# Patient Record
Sex: Female | Born: 1935 | Race: Black or African American | Hispanic: No | Marital: Single | Smoking: Former smoker
Health system: Southern US, Community
[De-identification: ages and names within clinical notes are randomized; demographics above are authoritative.]

## PROBLEM LIST (undated history)

## (undated) DIAGNOSIS — I639 Cerebral infarction, unspecified: Secondary | ICD-10-CM

## (undated) DIAGNOSIS — E785 Hyperlipidemia, unspecified: Secondary | ICD-10-CM

## (undated) DIAGNOSIS — R296 Repeated falls: Secondary | ICD-10-CM

## (undated) DIAGNOSIS — R195 Other fecal abnormalities: Secondary | ICD-10-CM

## (undated) DIAGNOSIS — I10 Essential (primary) hypertension: Secondary | ICD-10-CM

## (undated) DIAGNOSIS — I82409 Acute embolism and thrombosis of unspecified deep veins of unspecified lower extremity: Secondary | ICD-10-CM

## (undated) DIAGNOSIS — K219 Gastro-esophageal reflux disease without esophagitis: Secondary | ICD-10-CM

## (undated) DIAGNOSIS — R338 Other retention of urine: Secondary | ICD-10-CM

## (undated) DIAGNOSIS — F0390 Unspecified dementia without behavioral disturbance: Secondary | ICD-10-CM

## (undated) DIAGNOSIS — I4891 Unspecified atrial fibrillation: Secondary | ICD-10-CM

## (undated) DIAGNOSIS — I2699 Other pulmonary embolism without acute cor pulmonale: Secondary | ICD-10-CM

## (undated) DIAGNOSIS — S2020XA Contusion of thorax, unspecified, initial encounter: Secondary | ICD-10-CM

## (undated) DIAGNOSIS — D649 Anemia, unspecified: Secondary | ICD-10-CM

## (undated) DIAGNOSIS — M48061 Spinal stenosis, lumbar region without neurogenic claudication: Secondary | ICD-10-CM

## (undated) HISTORY — DX: Gastro-esophageal reflux disease without esophagitis: K21.9

## (undated) HISTORY — DX: Contusion of thorax, unspecified, initial encounter: S20.20XA

## (undated) HISTORY — DX: Cerebral infarction, unspecified: I63.9

## (undated) HISTORY — DX: Essential (primary) hypertension: I10

## (undated) HISTORY — DX: Anemia, unspecified: D64.9

## (undated) HISTORY — DX: Unspecified atrial fibrillation: I48.91

## (undated) HISTORY — DX: Other pulmonary embolism without acute cor pulmonale: I26.99

## (undated) HISTORY — DX: Hyperlipidemia, unspecified: E78.5

## (undated) HISTORY — DX: Spinal stenosis, lumbar region without neurogenic claudication: M48.061

## (undated) HISTORY — DX: Other fecal abnormalities: R19.5

## (undated) HISTORY — DX: Other retention of urine: R33.8

## (undated) HISTORY — DX: Acute embolism and thrombosis of unspecified deep veins of unspecified lower extremity: I82.409

## (undated) HISTORY — DX: Repeated falls: R29.6

## (undated) HISTORY — DX: Unspecified dementia, unspecified severity, without behavioral disturbance, psychotic disturbance, mood disturbance, and anxiety: F03.90

---

## 2003-05-12 ENCOUNTER — Ambulatory Visit (HOSPITAL_COMMUNITY): Admission: RE | Admit: 2003-05-12 | Discharge: 2003-05-12 | Payer: Self-pay | Admitting: Neurology

## 2005-03-11 ENCOUNTER — Emergency Department: Payer: Self-pay | Admitting: Emergency Medicine

## 2005-06-29 ENCOUNTER — Inpatient Hospital Stay: Payer: Self-pay | Admitting: General Practice

## 2006-02-06 ENCOUNTER — Inpatient Hospital Stay: Payer: Self-pay | Admitting: General Practice

## 2006-02-06 ENCOUNTER — Other Ambulatory Visit: Payer: Self-pay

## 2006-04-12 ENCOUNTER — Ambulatory Visit: Payer: Self-pay | Admitting: General Practice

## 2006-04-17 ENCOUNTER — Inpatient Hospital Stay: Payer: Self-pay | Admitting: General Practice

## 2007-07-08 ENCOUNTER — Other Ambulatory Visit: Payer: Self-pay

## 2007-07-08 ENCOUNTER — Ambulatory Visit: Payer: Self-pay | Admitting: Podiatry

## 2007-07-12 ENCOUNTER — Ambulatory Visit: Payer: Self-pay | Admitting: Podiatry

## 2008-01-07 ENCOUNTER — Ambulatory Visit: Payer: Self-pay | Admitting: Internal Medicine

## 2008-02-12 ENCOUNTER — Other Ambulatory Visit: Payer: Self-pay

## 2008-02-12 ENCOUNTER — Inpatient Hospital Stay: Payer: Self-pay | Admitting: Internal Medicine

## 2008-12-27 ENCOUNTER — Inpatient Hospital Stay: Payer: Self-pay | Admitting: Internal Medicine

## 2009-02-18 ENCOUNTER — Ambulatory Visit: Payer: Self-pay | Admitting: General Practice

## 2009-03-12 ENCOUNTER — Inpatient Hospital Stay: Payer: Self-pay | Admitting: General Practice

## 2009-03-17 ENCOUNTER — Encounter: Payer: Self-pay | Admitting: Internal Medicine

## 2009-03-25 ENCOUNTER — Encounter: Payer: Self-pay | Admitting: Internal Medicine

## 2010-03-24 ENCOUNTER — Ambulatory Visit: Payer: Self-pay | Admitting: Internal Medicine

## 2010-12-02 ENCOUNTER — Ambulatory Visit: Payer: Self-pay

## 2018-11-25 ENCOUNTER — Encounter
Admission: RE | Admit: 2018-11-25 | Discharge: 2018-11-25 | Disposition: A | Discharge status: Home / Self Care | Payer: Medicare Other | Source: Ambulatory Visit | Attending: Internal Medicine | Admitting: Internal Medicine

## 2018-12-04 ENCOUNTER — Encounter: Payer: Self-pay | Admitting: Adult Health

## 2018-12-04 ENCOUNTER — Non-Acute Institutional Stay (SKILLED_NURSING_FACILITY): Payer: Medicare Other | Admitting: Adult Health

## 2018-12-04 DIAGNOSIS — F0150 Vascular dementia without behavioral disturbance: Secondary | ICD-10-CM

## 2018-12-04 DIAGNOSIS — I82591 Chronic embolism and thrombosis of other specified deep vein of right lower extremity: Secondary | ICD-10-CM | POA: Diagnosis not present

## 2018-12-04 DIAGNOSIS — I2782 Chronic pulmonary embolism: Secondary | ICD-10-CM

## 2018-12-04 DIAGNOSIS — R5381 Other malaise: Secondary | ICD-10-CM

## 2018-12-04 DIAGNOSIS — E785 Hyperlipidemia, unspecified: Secondary | ICD-10-CM

## 2018-12-04 DIAGNOSIS — I4819 Other persistent atrial fibrillation: Secondary | ICD-10-CM | POA: Diagnosis not present

## 2018-12-04 DIAGNOSIS — I10 Essential (primary) hypertension: Secondary | ICD-10-CM | POA: Diagnosis not present

## 2018-12-04 DIAGNOSIS — K219 Gastro-esophageal reflux disease without esophagitis: Secondary | ICD-10-CM

## 2018-12-04 DIAGNOSIS — D500 Iron deficiency anemia secondary to blood loss (chronic): Secondary | ICD-10-CM

## 2018-12-04 DIAGNOSIS — R338 Other retention of urine: Secondary | ICD-10-CM

## 2018-12-04 NOTE — Progress Notes (Signed)
[facility]:   [facility] Room Number: [id number] Place of Service:  SNF (31)   CODE STATUS: DNR  Allergies  Allergen Reactions  . Trazodone And Nefazodone Other (See Comments)    Chief Complaint  Patient presents with  . Medical Management of Chronic Issues    Essential hypertension; persistent atrial fibrillation; chronic deep vein thrombosis of other vein of right lower extremity. Weekly follow up for the first 30 days post hospitalization.     HPI:  She is a 84 year old short term resident being seen for the management of her chronic illnesses: hypertension; dvt; afib. She is unable to fully participate in the hpi or ros; she does deny any chest pain; no change in appetite; no pain.   Past Medical History:  Diagnosis Date  . Acute retention of urine   . Anemia   . Atrial fibrillation (HCC)   . Dementia (HCC)   . DVT (deep venous thrombosis) (HCC)    on right lower extremity   . Falls frequently   . GERD (gastroesophageal reflux disease)   . Heme positive stool   . Hyperlipidemia   . Hypertension   . Lumbar spinal stenosis   . Pulmonary emboli (HCC)   . Stroke (HCC)   . Traumatic hematoma of right thoracic region     No past surgical history on file.  Social History   Socioeconomic History  . Marital status: Single    Spouse name: Not on file  . Number of children: Not on file  . Years of education: Not on file  . Highest education level: Not on file  Occupational History  . Not on file  Social Needs  . Financial resource strain: Not on file  . Food insecurity:    Worry: Not on file    Inability: Not on file  . Transportation needs:    Medical: Not on file    Non-medical: Not on file  Tobacco Use  . Smoking status: Former Smoker    Last attempt to quit: 1988    Years since quitting: 31.9  . Smokeless tobacco: Never Used  Substance and Sexual Activity  . Alcohol use: Never    Frequency: Never  . Drug use: Never  . Sexual  activity: Not Currently  Lifestyle  . Physical activity:    Days per week: Not on file    Minutes per session: Not on file  . Stress: Not on file  Relationships  . Social connections:    Talks on phone: Not on file    Gets together: Not on file    Attends religious service: Not on file    Active member of club or organization: Not on file    Attends meetings of clubs or organizations: Not on file    Relationship status: Not on file  . Intimate partner violence:    Fear of current or ex partner: Not on file    Emotionally abused: Not on file    Physically abused: Not on file    Forced sexual activity: Not on file  Other Topics Concern  . Not on file  Social History Narrative  . Not on file   No family history on file.    VITAL SIGNS BP (!) 155/62   Pulse 73   Temp 97.9 F (36.6 C)   Resp 18   Wt 130 lb 9.6 oz (59.2 kg)   SpO2 100%   Outpatient Encounter Medications as of 12/04/2018  Medication  Sig  . acetaminophen (TYLENOL) 325 MG tablet Take 650 mg by mouth every 4 (four) hours as needed.  Marland Kitchen. amLODipine (NORVASC) 5 MG tablet Take 5 mg by mouth daily.  Marland Kitchen. aspirin 325 MG tablet Take 325 mg by mouth daily.  Marland Kitchen. atorvastatin (LIPITOR) 10 MG tablet Take 10 mg by mouth at bedtime.  . calcium-vitamin D (OSCAL WITH D) 500-200 MG-UNIT tablet Take 1 tablet by mouth daily.  Marland Kitchen. donepezil (ARICEPT) 10 MG tablet Take 10 mg by mouth at bedtime.  . ferrous sulfate 325 (65 FE) MG tablet Take 325 mg by mouth daily with breakfast.  . losartan (COZAAR) 50 MG tablet Take 50 mg by mouth daily.  . Metoprolol Tartrate 75 MG TABS Take 1 tablet by mouth 2 (two) times daily.  . Multiple Vitamins-Minerals (MULTIVITAMIN) tablet Take 1 tablet by mouth daily.  . NON FORMULARY Diet Type: Regular  . pantoprazole (PROTONIX) 40 MG tablet Take 40 mg by mouth daily.  . tamsulosin (FLOMAX) 0.4 MG CAPS capsule Take 0.4 mg by mouth daily.  . vitamin B-12 (CYANOCOBALAMIN) 250 MCG tablet Take 250 mcg by mouth  daily.  . Vitamin D, Cholecalciferol, 25 MCG (1000 UT) TABS Take 1 tablet by mouth daily.   No facility-administered encounter medications on file as of 12/04/2018.      SIGNIFICANT DIAGNOSTIC EXAMS  LABS REVIEWED TODAY:   11-21-18: wbc 5.9; hgb 10.8; hct 34.0; plt 179; bun 21; creat 0.65; k+ 4.2 na++ 140; ca 9.0   Review of Systems  Unable to perform ROS: Dementia (confusion )   Physical Exam Constitutional:      General: She is not in acute distress.    Appearance: Normal appearance. She is well-developed and normal weight. She is not diaphoretic.  HENT:     Mouth/Throat:     Mouth: Mucous membranes are moist.     Pharynx: Oropharynx is clear.  Neck:     Thyroid: No thyromegaly.  Cardiovascular:     Rate and Rhythm: Normal rate and regular rhythm.     Pulses: Normal pulses.     Heart sounds: Normal heart sounds.  Pulmonary:     Effort: Pulmonary effort is normal. No respiratory distress.     Breath sounds: Normal breath sounds.  Abdominal:     General: Bowel sounds are normal. There is no distension.     Palpations: Abdomen is soft.     Tenderness: There is no abdominal tenderness.  Musculoskeletal:     Right lower leg: No edema.     Left lower leg: No edema.     Comments: Is able to move all extremities   Lymphadenopathy:     Cervical: No cervical adenopathy.  Skin:    General: Skin is warm and dry.  Neurological:     General: No focal deficit present.     Mental Status: She is alert. Mental status is at baseline.  Psychiatric:        Mood and Affect: Mood normal.       ASSESSMENT/ PLAN:  TODAY:    1. Essential hypertension: is stable b/p 155/62: will continue norvasc 5 mg daily cozaar 50 mg daily and lopressor 75 mg twice daily   2. Persistent atrial fibrillation: heart rate is stable: will continue asa 325 mg daily and takes lopressor 75 mg twice daily   3. DVT right lower extremity/ PE: is stable will continue asa 325 mg daily   4. Dyslipidemia:  is stable will continue lipitor 10 mg daily  5. gerd without esophagitis: is stable will continue protonix 40 mg daily   6. Acute urinary retention: is stable will continue flomax 0.4 mg daily   7. Chronic blood loss anemia: stable hgb is 10.8; will continue iron daily   8. Vascular dementia without behavioral disturbance: is without change: weight is 130 pounds; will continue aricept 10 mg daily   9. Physical deconditioning: will continue therapy as directed to improve upon her level of independence with her adls.    MD is aware of resident's narcotic use and is in agreement with current plan of care. We will attempt to wean resident as apropriate   Synthia Innocent NP [facility]  Contact [phone] Monday through Friday 8am- 5pm  After hours call [phone]

## 2018-12-05 ENCOUNTER — Non-Acute Institutional Stay (SKILLED_NURSING_FACILITY): Payer: Medicare Other | Admitting: Adult Health

## 2018-12-05 ENCOUNTER — Encounter: Payer: Self-pay | Admitting: Adult Health

## 2018-12-05 DIAGNOSIS — F0150 Vascular dementia without behavioral disturbance: Secondary | ICD-10-CM | POA: Diagnosis not present

## 2018-12-05 DIAGNOSIS — R5381 Other malaise: Secondary | ICD-10-CM | POA: Diagnosis not present

## 2018-12-05 DIAGNOSIS — D500 Iron deficiency anemia secondary to blood loss (chronic): Secondary | ICD-10-CM

## 2018-12-05 NOTE — Progress Notes (Signed)
[facility] Room Number: 208 B Place of Service:  SNF (31)   CODE STATUS: DNR  Allergies  Allergen Reactions  . Trazodone And Nefazodone Other (See Comments)    Chief Complaint  Patient presents with  . Acute Visit    Care Plan Meeting    HPI:  We have come together for her routine care plan meeting. More than likely this does look a long term placement for her. She has not stood for years; she will need to be able transfer herself. At this time she requires maximum assistance with transfers at this time; she continues to participate in therapy. She will continue to be followed for her chronic illnesses: dementia; gerd; hypertension. There are no reports of uncontrolled pain; no changes in appetite; no anxiety or agitation.    Past Medical History:  Diagnosis Date  . Acute retention of urine   . Anemia   . Atrial fibrillation (HCC)   . Dementia (HCC)   . DVT (deep venous thrombosis) (HCC)    on right lower extremity   . Falls frequently   . GERD (gastroesophageal reflux disease)   . Heme positive stool   . Hyperlipidemia   . Hypertension   . Lumbar spinal stenosis   . Pulmonary emboli (HCC)   . Stroke (HCC)   . Traumatic hematoma of right thoracic region     History reviewed. No pertinent surgical history.  Social History   Socioeconomic History  . Marital status: Single    Spouse name: Not on file  . Number of children: Not on file  . Years of education: Not on file  . Highest education level: Not on file  Occupational History  . Not on file  Social Needs  . Financial resource strain: Not on file  . Food insecurity:    Worry: Not on file    Inability: Not on file  . Transportation needs:    Medical: Not on file    Non-medical: Not on file  Tobacco Use  . Smoking status: Former Smoker    Last attempt to quit: 1988    Years since quitting: 31.9  . Smokeless tobacco: Never Used  Substance and Sexual Activity  .  Alcohol use: Never    Frequency: Never  . Drug use: Never  . Sexual activity: Not Currently  Lifestyle  . Physical activity:    Days per week: Not on file    Minutes per session: Not on file  . Stress: Not on file  Relationships  . Social connections:    Talks on phone: Not on file    Gets together: Not on file    Attends religious service: Not on file    Active member of club or organization: Not on file    Attends meetings of clubs or organizations: Not on file    Relationship status: Not on file  . Intimate partner violence:    Fear of current or ex partner: Not on file    Emotionally abused: Not on file    Physically abused: Not on file    Forced sexual activity: Not on file  Other Topics Concern  . Not on file  Social History Narrative  . Not on file   History reviewed. No pertinent family history.    VITAL SIGNS BP (!) 169/68   Pulse (!) 56   Temp 98 F (36.7 C)   Resp 18   Wt 130 lb 9.6 oz (59.2  kg)   SpO2 100%   Outpatient Encounter Medications as of 12/05/2018  Medication Sig  . acetaminophen (TYLENOL) 325 MG tablet Take 650 mg by mouth every 4 (four) hours as needed.  Marland Kitchen amLODipine (NORVASC) 5 MG tablet Take 5 mg by mouth daily.  Marland Kitchen aspirin 325 MG tablet Take 325 mg by mouth daily.  Marland Kitchen atorvastatin (LIPITOR) 10 MG tablet Take 10 mg by mouth at bedtime.  . calcium-vitamin D (OSCAL WITH D) 500-200 MG-UNIT tablet Take 1 tablet by mouth daily.  Marland Kitchen donepezil (ARICEPT) 10 MG tablet Take 10 mg by mouth at bedtime.  . ferrous sulfate 325 (65 FE) MG tablet Take 325 mg by mouth daily with breakfast.  . losartan (COZAAR) 50 MG tablet Take 50 mg by mouth daily.  . Metoprolol Tartrate 75 MG TABS Take 1 tablet by mouth 2 (two) times daily.  . Multiple Vitamins-Minerals (MULTIVITAMIN) tablet Take 1 tablet by mouth daily.  . NON FORMULARY Diet Type: Regular  . pantoprazole (PROTONIX) 40 MG tablet Take 40 mg by mouth daily.  . tamsulosin (FLOMAX) 0.4 MG CAPS capsule Take  0.4 mg by mouth daily.  . vitamin B-12 (CYANOCOBALAMIN) 250 MCG tablet Take 250 mcg by mouth daily.  . Vitamin D, Cholecalciferol, 25 MCG (1000 UT) TABS Take 1 tablet by mouth daily.   No facility-administered encounter medications on file as of 12/05/2018.      SIGNIFICANT DIAGNOSTIC EXAMS   LABS REVIEWED PREVIOUS:   11-21-18: wbc 5.9; hgb 10.8; hct 34.0; plt 179; bun 21; creat 0.65; k+ 4.2 na++ 140; ca 9.0  NO NEW LABS.     Review of Systems  Unable to perform ROS: Dementia (confusion)     Physical Exam Constitutional:      General: She is not in acute distress.    Appearance: Normal appearance. She is well-developed. She is not diaphoretic.  Neck:     Musculoskeletal: Neck supple.     Thyroid: No thyromegaly.  Cardiovascular:     Rate and Rhythm: Normal rate and regular rhythm.     Pulses: Normal pulses.     Heart sounds: Normal heart sounds.  Pulmonary:     Effort: Pulmonary effort is normal. No respiratory distress.     Breath sounds: Normal breath sounds.  Abdominal:     General: Bowel sounds are normal. There is no distension.     Palpations: Abdomen is soft.     Tenderness: There is no abdominal tenderness.  Musculoskeletal:     Right lower leg: No edema.     Left lower leg: No edema.     Comments: Is able to move all extremities   Lymphadenopathy:     Cervical: No cervical adenopathy.  Skin:    General: Skin is warm and dry.  Neurological:     General: No focal deficit present.     Mental Status: She is alert. Mental status is at baseline.  Psychiatric:        Mood and Affect: Mood normal.     ASSESSMENT/ PLAN:  TODAY:   1. Vascular dementia without behavioral disturbance 2. Physical deconditioning 3. Chronic blood loss anemia:   Will continue her current medications Will continue her current plan of care Will continue therapy as directed.     MD is aware of resident's narcotic use and is in agreement with current plan of care. We will  attempt to wean resident as apropriate   Synthia Innocent NP [facility]  Contact [phone] Monday through  Friday 8am- 5pm  After hours call [phone]

## 2018-12-09 ENCOUNTER — Encounter: Payer: Self-pay | Admitting: Adult Health

## 2018-12-09 DIAGNOSIS — I82501 Chronic embolism and thrombosis of unspecified deep veins of right lower extremity: Secondary | ICD-10-CM | POA: Insufficient documentation

## 2018-12-09 DIAGNOSIS — I10 Essential (primary) hypertension: Secondary | ICD-10-CM | POA: Insufficient documentation

## 2018-12-09 DIAGNOSIS — F0150 Vascular dementia without behavioral disturbance: Secondary | ICD-10-CM | POA: Insufficient documentation

## 2018-12-09 DIAGNOSIS — D500 Iron deficiency anemia secondary to blood loss (chronic): Secondary | ICD-10-CM | POA: Insufficient documentation

## 2018-12-09 DIAGNOSIS — I4819 Other persistent atrial fibrillation: Secondary | ICD-10-CM | POA: Insufficient documentation

## 2018-12-09 DIAGNOSIS — E785 Hyperlipidemia, unspecified: Secondary | ICD-10-CM | POA: Insufficient documentation

## 2018-12-09 DIAGNOSIS — R338 Other retention of urine: Secondary | ICD-10-CM | POA: Insufficient documentation

## 2018-12-09 DIAGNOSIS — I2699 Other pulmonary embolism without acute cor pulmonale: Secondary | ICD-10-CM | POA: Insufficient documentation

## 2018-12-09 DIAGNOSIS — R5381 Other malaise: Secondary | ICD-10-CM | POA: Insufficient documentation

## 2018-12-09 DIAGNOSIS — K219 Gastro-esophageal reflux disease without esophagitis: Secondary | ICD-10-CM | POA: Insufficient documentation

## 2018-12-11 ENCOUNTER — Encounter: Payer: Self-pay | Admitting: Adult Health

## 2018-12-11 ENCOUNTER — Non-Acute Institutional Stay (SKILLED_NURSING_FACILITY): Payer: Medicare Other | Admitting: Adult Health

## 2018-12-11 DIAGNOSIS — K219 Gastro-esophageal reflux disease without esophagitis: Secondary | ICD-10-CM | POA: Diagnosis not present

## 2018-12-11 DIAGNOSIS — E785 Hyperlipidemia, unspecified: Secondary | ICD-10-CM

## 2018-12-11 DIAGNOSIS — R338 Other retention of urine: Secondary | ICD-10-CM | POA: Diagnosis not present

## 2018-12-11 NOTE — Progress Notes (Signed)
[facility] Room Number: 208 B Place of Service:  SNF (31)   CODE STATUS: DNR  Allergies  Allergen Reactions  . Trazodone And Nefazodone Other (See Comments)    Chief Complaint  Patient presents with  . Medical Management of Chronic Issues    gerd without esophagitis; acute retention of urine; dyslipidemia. Weekly follow up for the first 30 days post hospitalization.     HPI:  She is a 84 year old long term resident of this facility being seen for the management of her chronic illnesses: gerd; urine retention; dyslipidemia. She is unable to fully participate in the hpi or ros; there are no reports of heart burn; no uncontrolled pain; no anxiety or insomnia.   Past Medical History:  Diagnosis Date  . Acute retention of urine   . Anemia   . Atrial fibrillation (HCC)   . Dementia (HCC)   . DVT (deep venous thrombosis) (HCC)    on right lower extremity   . Falls frequently   . GERD (gastroesophageal reflux disease)   . Heme positive stool   . Hyperlipidemia   . Hypertension   . Lumbar spinal stenosis   . Pulmonary emboli (HCC)   . Stroke (HCC)   . Traumatic hematoma of right thoracic region     History reviewed. No pertinent surgical history.  Social History   Socioeconomic History  . Marital status: Single    Spouse name: Not on file  . Number of children: Not on file  . Years of education: Not on file  . Highest education level: Not on file  Occupational History  . Not on file  Social Needs  . Financial resource strain: Not on file  . Food insecurity:    Worry: Not on file    Inability: Not on file  . Transportation needs:    Medical: Not on file    Non-medical: Not on file  Tobacco Use  . Smoking status: Former Smoker    Last attempt to quit: 1988    Years since quitting: 31.9  . Smokeless tobacco: Never Used  Substance and Sexual Activity  . Alcohol use: Never    Frequency: Never  . Drug use: Never  . Sexual  activity: Not Currently  Lifestyle  . Physical activity:    Days per week: Not on file    Minutes per session: Not on file  . Stress: Not on file  Relationships  . Social connections:    Talks on phone: Not on file    Gets together: Not on file    Attends religious service: Not on file    Active member of club or organization: Not on file    Attends meetings of clubs or organizations: Not on file    Relationship status: Not on file  . Intimate partner violence:    Fear of current or ex partner: Not on file    Emotionally abused: Not on file    Physically abused: Not on file    Forced sexual activity: Not on file  Other Topics Concern  . Not on file  Social History Narrative  . Not on file   History reviewed. No pertinent family history.    VITAL SIGNS BP (!) 122/59   Pulse 67   Temp 98.8 F (37.1 C)   Resp 20   Wt 136 lb 6.2 oz (61.9 kg)   SpO2 100%   Outpatient Encounter Medications as of 12/11/2018  Medication  Sig  . acetaminophen (TYLENOL) 325 MG tablet Take 650 mg by mouth every 4 (four) hours as needed.  Marland Kitchen amLODipine (NORVASC) 5 MG tablet Take 5 mg by mouth daily.  Marland Kitchen aspirin 325 MG tablet Take 325 mg by mouth daily.  Marland Kitchen atorvastatin (LIPITOR) 10 MG tablet Take 10 mg by mouth at bedtime.  . calcium-vitamin D (OSCAL WITH D) 500-200 MG-UNIT tablet Take 1 tablet by mouth daily.  Marland Kitchen donepezil (ARICEPT) 10 MG tablet Take 10 mg by mouth at bedtime.  . ferrous sulfate 325 (65 FE) MG tablet Take 325 mg by mouth daily with breakfast.  . losartan (COZAAR) 50 MG tablet Take 50 mg by mouth daily.  . Metoprolol Tartrate 75 MG TABS Take 1 tablet by mouth 2 (two) times daily.  . Multiple Vitamins-Minerals (MULTIVITAMIN) tablet Take 1 tablet by mouth daily.  . NON FORMULARY Diet Type: Regular  . pantoprazole (PROTONIX) 40 MG tablet Take 40 mg by mouth daily.  . tamsulosin (FLOMAX) 0.4 MG CAPS capsule Take 0.4 mg by mouth daily.  . vitamin B-12 (CYANOCOBALAMIN) 250 MCG tablet  Take 250 mcg by mouth daily.  . Vitamin D, Cholecalciferol, 25 MCG (1000 UT) TABS Take 1 tablet by mouth daily.   No facility-administered encounter medications on file as of 12/11/2018.      SIGNIFICANT DIAGNOSTIC EXAMS  LABS REVIEWED PREVIOUS:   11-21-18: wbc 5.9; hgb 10.8; hct 34.0; plt 179; bun 21; creat 0.65; k+ 4.2 na++ 140; ca 9.0  NO NEW LABS.     Review of Systems  Unable to perform ROS: Dementia (confusion)    Physical Exam Constitutional:      General: She is not in acute distress.    Appearance: Normal appearance. She is well-developed. She is not diaphoretic.  Neck:     Musculoskeletal: Neck supple.     Thyroid: No thyromegaly.  Cardiovascular:     Rate and Rhythm: Normal rate and regular rhythm.     Pulses: Normal pulses.     Heart sounds: Normal heart sounds.  Pulmonary:     Effort: Pulmonary effort is normal. No respiratory distress.     Breath sounds: Normal breath sounds.  Abdominal:     General: Bowel sounds are normal. There is no distension.     Palpations: Abdomen is soft.     Tenderness: There is no abdominal tenderness.  Musculoskeletal:     Right lower leg: No edema.     Left lower leg: No edema.     Comments: Is able to move all extremities   Lymphadenopathy:     Cervical: No cervical adenopathy.  Skin:    General: Skin is warm and dry.  Neurological:     Mental Status: She is alert. Mental status is at baseline.  Psychiatric:        Mood and Affect: Mood normal.     ASSESSMENT/ PLAN:  TODAY:    1. Dyslipidemia: is stable will continue lipitor 10 mg daily   2. gerd without esophagitis: is stable will continue protonix 40 mg daily   3. Acute urinary retention: is stable will continue flomax 0.4 mg daily   PREVIOUS  4. Chronic blood loss anemia: stable hgb is 10.8; will continue iron daily   5. Vascular dementia without behavioral disturbance: is without change: weight is 136 (previous 130) pounds; will continue aricept 10 mg  daily   6. Physical deconditioning: will continue therapy as directed to improve upon her level of independence with her adls.  7. Essential hypertension: is stable b/p 128/59: will continue norvasc 5 mg daily cozaar 50 mg daily and lopressor 75 mg twice daily   8. Persistent atrial fibrillation: heart rate is stable: will continue asa 325 mg daily and takes lopressor 75 mg twice daily   9. DVT right lower extremity/ PE: is stable will continue asa 325 mg daily    MD is aware of resident's narcotic use and is in agreement with current plan of care. We will attempt to wean resident as apropriate   Synthia Innocent NP [facility]  Contact [phone] Monday through Friday 8am- 5pm  After hours call [phone]

## 2018-12-17 ENCOUNTER — Encounter: Payer: Self-pay | Admitting: Adult Health

## 2018-12-17 ENCOUNTER — Non-Acute Institutional Stay (SKILLED_NURSING_FACILITY): Payer: Medicare Other | Admitting: Adult Health

## 2018-12-17 DIAGNOSIS — F0150 Vascular dementia without behavioral disturbance: Secondary | ICD-10-CM | POA: Diagnosis not present

## 2018-12-17 DIAGNOSIS — D500 Iron deficiency anemia secondary to blood loss (chronic): Secondary | ICD-10-CM | POA: Diagnosis not present

## 2018-12-17 DIAGNOSIS — I10 Essential (primary) hypertension: Secondary | ICD-10-CM

## 2018-12-17 NOTE — Progress Notes (Signed)
Location:   [facility] Room Number: 300 Place of Service:  SNF (31)   CODE STATUS: dnr  Allergies  Allergen Reactions  . Trazodone And Nefazodone Other (See Comments)    Chief Complaint  Patient presents with  . Medical Management of Chronic Issues    Essential hypertension; vascular dementia without behavioral disturbance; chronic blood loss anemia. Weekly follow up for the first 30 days post hospitalization.     HPI:  She is a 84 year old long term resident of this facility being seen for the management of her chronic illnesses: hypertension; dementia; anemia. She is unable to fully participate in the hpi or ros; but does deny any uncontrolled pain; no change in her appetite; no insomnia.   Past Medical History:  Diagnosis Date  . Acute retention of urine   . Anemia   . Atrial fibrillation (HCC)   . Dementia (HCC)   . DVT (deep venous thrombosis) (HCC)    on right lower extremity   . Falls frequently   . GERD (gastroesophageal reflux disease)   . Heme positive stool   . Hyperlipidemia   . Hypertension   . Lumbar spinal stenosis   . Pulmonary emboli (HCC)   . Stroke (HCC)   . Traumatic hematoma of right thoracic region     History reviewed. No pertinent surgical history.  Social History   Socioeconomic History  . Marital status: Single    Spouse name: Not on file  . Number of children: Not on file  . Years of education: Not on file  . Highest education level: Not on file  Occupational History  . Not on file  Social Needs  . Financial resource strain: Not on file  . Food insecurity:    Worry: Not on file    Inability: Not on file  . Transportation needs:    Medical: Not on file    Non-medical: Not on file  Tobacco Use  . Smoking status: Former Smoker    Last attempt to quit: 1988    Years since quitting: 32.0  . Smokeless tobacco: Never Used  Substance and Sexual Activity  . Alcohol use: Never    Frequency: Never  . Drug use: Never   . Sexual activity: Not Currently  Lifestyle  . Physical activity:    Days per week: Not on file    Minutes per session: Not on file  . Stress: Not on file  Relationships  . Social connections:    Talks on phone: Not on file    Gets together: Not on file    Attends religious service: Not on file    Active member of club or organization: Not on file    Attends meetings of clubs or organizations: Not on file    Relationship status: Not on file  . Intimate partner violence:    Fear of current or ex partner: Not on file    Emotionally abused: Not on file    Physically abused: Not on file    Forced sexual activity: Not on file  Other Topics Concern  . Not on file  Social History Narrative   Patient is a long term resident [facility] [address] [place]    History reviewed. No pertinent family history.    VITAL SIGNS BP 137/60   Pulse 66   Temp 98.1 F (36.7 C)   Resp 16   Wt 138 lb 12.8 oz (63 kg)   SpO2 100%  Outpatient Encounter Medications as of 12/17/2018  Medication Sig  . acetaminophen (TYLENOL) 325 MG tablet Take 650 mg by mouth every 4 (four) hours as needed. for pain/ increased temp. May be administered orally, per G-tube if needed or rectally if unable to swallow (separate order). Maximum dose for 24 hours is 3,000 mg from all sources of Acetaminophen/ Tylenol  . amLODipine (NORVASC) 5 MG tablet Take 5 mg by mouth daily.   Marland Kitchen aspirin 325 MG tablet Take 325 mg by mouth daily. Hx of afib  . atorvastatin (LIPITOR) 10 MG tablet Take 10 mg by mouth at bedtime.  . calcium-vitamin D (OSCAL WITH D) 500-200 MG-UNIT tablet Take 1 tablet by mouth daily.  Marland Kitchen donepezil (ARICEPT) 10 MG tablet Take 10 mg by mouth at bedtime.   . ferrous sulfate 325 (65 FE) MG tablet Take 325 mg by mouth daily with breakfast.   . losartan (COZAAR) 50 MG tablet Take 50 mg by mouth daily.  . Metoprolol Tartrate 75 MG TABS Take 1 tablet by mouth 2 (two) times daily.   . NON FORMULARY Diet  Type: Regular  . pantoprazole (PROTONIX) 40 MG tablet Take 40 mg by mouth daily.  . tamsulosin (FLOMAX) 0.4 MG CAPS capsule Take 0.4 mg by mouth daily.  Marland Kitchen therapeutic multivitamin-minerals (THERAGRAN-M) tablet Take 1 tablet by mouth daily.  . vitamin B-12 (CYANOCOBALAMIN) 250 MCG tablet Take 250 mcg by mouth daily.  . Vitamin D, Cholecalciferol, 25 MCG (1000 UT) TABS Take 1 tablet by mouth daily.   No facility-administered encounter medications on file as of 12/17/2018.      SIGNIFICANT DIAGNOSTIC EXAMS   LABS REVIEWED PREVIOUS:   11-21-18: wbc 5.9; hgb 10.8; hct 34.0; plt 179; bun 21; creat 0.65; k+ 4.2 na++ 140; ca 9.0  NO NEW LABS.        Review of Systems  Unable to perform ROS: Dementia (confusion)    Physical Exam Constitutional:      General: She is not in acute distress.    Appearance: Normal appearance. She is well-developed. She is not diaphoretic.  Neck:     Musculoskeletal: Neck supple.     Thyroid: No thyromegaly.  Cardiovascular:     Rate and Rhythm: Normal rate and regular rhythm.     Pulses: Normal pulses.     Heart sounds: Normal heart sounds.  Pulmonary:     Effort: Pulmonary effort is normal. No respiratory distress.     Breath sounds: Normal breath sounds.  Abdominal:     General: Bowel sounds are normal. There is no distension.     Palpations: Abdomen is soft.     Tenderness: There is no abdominal tenderness.  Musculoskeletal:     Right lower leg: No edema.     Left lower leg: No edema.     Comments: Is able to move all extremities   Lymphadenopathy:     Cervical: No cervical adenopathy.  Skin:    General: Skin is warm and dry.  Neurological:     Mental Status: She is alert. Mental status is at baseline.        ASSESSMENT/ PLAN:  TODAY:    1. Chronic blood loss anemia: stable hgb is 10.8; will continue iron daily   2. Vascular dementia without behavioral disturbance: is without change: weight is 138 (previous 136) pounds; will  continue aricept 10 mg daily   3. Essential hypertension: is stable b/p 137/60: will continue norvasc 5 mg daily cozaar 50 mg daily and lopressor  75 mg twice daily   PREVIOUS  4. Persistent atrial fibrillation: heart rate is stable: will continue asa 325 mg daily and takes lopressor 75 mg twice daily   5. DVT right lower extremity/ PE: is stable will continue asa 325 mg daily   6. Dyslipidemia: is stable will continue lipitor 10 mg daily   7. gerd without esophagitis: is stable will continue protonix 40 mg daily   8. Chronic  urinary retention: is stable will continue flomax 0.4 mg daily   Will check cbc; cmp lipids; vit d on 12-19-18    MD is aware of resident's narcotic use and is in agreement with current plan of care. We will attempt to wean resident as apropriate   Synthia Innocenteborah Bobbie Valletta NP [facility]  Contact [phone] Monday through Friday 8am- 5pm  After hours call [phone]

## 2018-12-19 ENCOUNTER — Other Ambulatory Visit
Admission: RE | Admit: 2018-12-19 | Discharge: 2018-12-19 | Disposition: A | Discharge status: Home / Self Care | Payer: Medicare Other | Source: Ambulatory Visit | Attending: Adult Health | Admitting: Adult Health

## 2018-12-19 DIAGNOSIS — D649 Anemia, unspecified: Secondary | ICD-10-CM | POA: Insufficient documentation

## 2018-12-19 DIAGNOSIS — E559 Vitamin D deficiency, unspecified: Secondary | ICD-10-CM | POA: Insufficient documentation

## 2018-12-19 DIAGNOSIS — I10 Essential (primary) hypertension: Secondary | ICD-10-CM | POA: Diagnosis present

## 2018-12-19 LAB — LIPID PANEL
CHOLESTEROL: 140 mg/dL (ref 0–200)
HDL: 54 mg/dL (ref >40)
LDL Cholesterol: 78 mg/dL (ref 0–99)
Total CHOL/HDL Ratio: 2.6 RATIO
Triglycerides: 38 mg/dL (ref <150)
VLDL: 8 mg/dL (ref 0–40)

## 2018-12-19 LAB — CBC WITH DIFFERENTIAL/PLATELET
Abs Immature Granulocytes: 0.02 10*3/uL (ref 0.00–0.07)
BASOS PCT: 1 %
Basophils Absolute: 0.1 10*3/uL (ref 0.0–0.1)
Eosinophils Absolute: 0.1 10*3/uL (ref 0.0–0.5)
Eosinophils Relative: 2 %
HCT: 33.7 % — ABNORMAL LOW (ref 36.0–46.0)
Hemoglobin: 10.7 g/dL — ABNORMAL LOW (ref 12.0–15.0)
Immature Granulocytes: 0 %
Lymphocytes Relative: 29 %
Lymphs Abs: 1.6 10*3/uL (ref 0.7–4.0)
MCH: 31.2 pg (ref 26.0–34.0)
MCHC: 31.8 g/dL (ref 30.0–36.0)
MCV: 98.3 fL (ref 80.0–100.0)
Monocytes Absolute: 0.7 10*3/uL (ref 0.1–1.0)
Monocytes Relative: 13 %
NEUTROS PCT: 55 %
NRBC: 0 % (ref 0.0–0.2)
Neutro Abs: 3 10*3/uL (ref 1.7–7.7)
PLATELETS: 157 10*3/uL (ref 150–400)
RBC: 3.43 MIL/uL — ABNORMAL LOW (ref 3.87–5.11)
RDW: 13.4 % (ref 11.5–15.5)
WBC: 5.5 10*3/uL (ref 4.0–10.5)

## 2018-12-19 LAB — COMPREHENSIVE METABOLIC PANEL
ALT: 10 U/L (ref 0–44)
AST: 17 U/L (ref 15–41)
Albumin: 3.1 g/dL — ABNORMAL LOW (ref 3.5–5.0)
Alkaline Phosphatase: 42 U/L (ref 38–126)
Anion gap: 4 — ABNORMAL LOW (ref 5–15)
BUN: 24 mg/dL — ABNORMAL HIGH (ref 8–23)
CO2: 27 mmol/L (ref 22–32)
Calcium: 8.8 mg/dL — ABNORMAL LOW (ref 8.9–10.3)
Chloride: 110 mmol/L (ref 98–111)
Creatinine, Ser: 0.73 mg/dL (ref 0.44–1.00)
GFR calc non Af Amer: >60 mL/min (ref >60)
Glucose, Bld: 83 mg/dL (ref 70–99)
Potassium: 3.7 mmol/L (ref 3.5–5.1)
Sodium: 141 mmol/L (ref 135–145)
Total Bilirubin: 1.1 mg/dL (ref 0.3–1.2)
Total Protein: 5.7 g/dL — ABNORMAL LOW (ref 6.5–8.1)

## 2018-12-20 LAB — VITAMIN D 25 HYDROXY (VIT D DEFICIENCY, FRACTURES): VIT D 25 HYDROXY: 28.8 ng/mL — AB (ref 30.0–100.0)

## 2018-12-25 ENCOUNTER — Encounter
Admission: RE | Admit: 2018-12-25 | Discharge: 2018-12-25 | Disposition: A | Discharge status: Home / Self Care | Payer: Medicare Other | Source: Ambulatory Visit | Attending: Internal Medicine | Admitting: Internal Medicine

## 2018-12-31 ENCOUNTER — Non-Acute Institutional Stay (SKILLED_NURSING_FACILITY): Payer: Medicare Other | Admitting: Adult Health

## 2018-12-31 ENCOUNTER — Encounter: Payer: Self-pay | Admitting: Adult Health

## 2018-12-31 DIAGNOSIS — I2782 Chronic pulmonary embolism: Secondary | ICD-10-CM | POA: Diagnosis not present

## 2018-12-31 DIAGNOSIS — K219 Gastro-esophageal reflux disease without esophagitis: Secondary | ICD-10-CM | POA: Diagnosis not present

## 2018-12-31 DIAGNOSIS — E785 Hyperlipidemia, unspecified: Secondary | ICD-10-CM | POA: Diagnosis not present

## 2018-12-31 DIAGNOSIS — I4819 Other persistent atrial fibrillation: Secondary | ICD-10-CM | POA: Diagnosis not present

## 2018-12-31 NOTE — Progress Notes (Signed)
[facility] Room Number: 307 B Place of Service:  SNF (31)   CODE STATUS: DNR  Allergies  Allergen Reactions  . Trazodone And Nefazodone Other (See Comments)    Chief Complaint  Patient presents with  . Medical Management of Chronic Issues    Persistent atrial fibrillation; other chronic pulmonary embolism without acute cor pulmonale; gerd without esophagitis; dyslipidemia     HPI:  She is a 85 year old long term resident of this facility being seen for the management of her chronic illnesses: afib; PE; gerd; dyslipidemia. There are no reports of uncontrolled pain; no changes in appetite; no anxiety or agitation present.   Past Medical History:  Diagnosis Date  . Acute retention of urine   . Anemia   . Atrial fibrillation (HCC)   . Dementia (HCC)   . DVT (deep venous thrombosis) (HCC)    on right lower extremity   . Falls frequently   . GERD (gastroesophageal reflux disease)   . Heme positive stool   . Hyperlipidemia   . Hypertension   . Lumbar spinal stenosis   . Pulmonary emboli (HCC)   . Stroke (HCC)   . Traumatic hematoma of right thoracic region     History reviewed. No pertinent surgical history.  Social History   Socioeconomic History  . Marital status: Single    Spouse name: Not on file  . Number of children: Not on file  . Years of education: Not on file  . Highest education level: Not on file  Occupational History  . Not on file  Social Needs  . Financial resource strain: Not on file  . Food insecurity:    Worry: Not on file    Inability: Not on file  . Transportation needs:    Medical: Not on file    Non-medical: Not on file  Tobacco Use  . Smoking status: Former Smoker    Last attempt to quit: 1988    Years since quitting: 32.0  . Smokeless tobacco: Never Used  Substance and Sexual Activity  . Alcohol use: Never    Frequency: Never  . Drug use: Never  . Sexual activity: Not Currently  Lifestyle    . Physical activity:    Days per week: Not on file    Minutes per session: Not on file  . Stress: Not on file  Relationships  . Social connections:    Talks on phone: Not on file    Gets together: Not on file    Attends religious service: Not on file    Active member of club or organization: Not on file    Attends meetings of clubs or organizations: Not on file    Relationship status: Not on file  . Intimate partner violence:    Fear of current or ex partner: Not on file    Emotionally abused: Not on file    Physically abused: Not on file    Forced sexual activity: Not on file  Other Topics Concern  . Not on file  Social History Narrative   Patient is a long term resident [facility] [address] [place]    History reviewed. No pertinent family history.    VITAL SIGNS BP 135/65   Pulse 62   Temp 97.6 F (36.4 C)   Resp 16   Wt 138 lb 12.8 oz (63 kg)   SpO2 100%   Outpatient Encounter Medications as of 12/31/2018  Medication Sig  .  acetaminophen (TYLENOL) 325 MG tablet Take 650 mg by mouth every 4 (four) hours as needed. for pain/ increased temp. May be administered orally, per G-tube if needed or rectally if unable to swallow (separate order). Maximum dose for 24 hours is 3,000 mg from all sources of Acetaminophen/ Tylenol  . amLODipine (NORVASC) 5 MG tablet Take 5 mg by mouth daily.   Marland Kitchen aspirin 325 MG tablet Take 325 mg by mouth daily. Hx of afib  . atorvastatin (LIPITOR) 10 MG tablet Take 10 mg by mouth at bedtime.  . calcium-vitamin D (OSCAL WITH D) 500-200 MG-UNIT tablet Take 1 tablet by mouth daily.  Marland Kitchen donepezil (ARICEPT) 10 MG tablet Take 10 mg by mouth at bedtime.   . ferrous sulfate 325 (65 FE) MG tablet Take 325 mg by mouth daily with breakfast.   . losartan (COZAAR) 50 MG tablet Take 50 mg by mouth daily.  . Metoprolol Tartrate 75 MG TABS Take 1 tablet by mouth 2 (two) times daily.   . NON FORMULARY Diet Type: Regular  . pantoprazole (PROTONIX) 40 MG  tablet Take 40 mg by mouth daily.  . tamsulosin (FLOMAX) 0.4 MG CAPS capsule Take 0.4 mg by mouth daily.  Marland Kitchen therapeutic multivitamin-minerals (THERAGRAN-M) tablet Take 1 tablet by mouth daily.  . vitamin B-12 (CYANOCOBALAMIN) 250 MCG tablet Take 250 mcg by mouth daily.  . Vitamin D, Cholecalciferol, 25 MCG (1000 UT) TABS Take 1 tablet by mouth daily.   No facility-administered encounter medications on file as of 12/31/2018.      SIGNIFICANT DIAGNOSTIC EXAMS  LABS REVIEWED PREVIOUS:   11-21-18: wbc 5.9; hgb 10.8; hct 34.0; plt 179; bun 21; creat 0.65; k+ 4.2 na++ 140; ca 9.0  TODAY  12-19-18: wbc 5.5; hgb 10.7; hct 33.7; mcv 98.;3 plt  157; glucose 83; bun 24; creat 0.73; k+ 3.7; na++ 141; ca 8.8; liver normal albumin 3.1; chol 140; ldl 78; trig 38; hdl 54; vit D 28.8     Review of Systems  Unable to perform ROS: Dementia (confusion)    Physical Exam Constitutional:      General: She is not in acute distress.    Appearance: Normal appearance. She is well-developed. She is not diaphoretic.  Neck:     Musculoskeletal: Neck supple.     Thyroid: No thyromegaly.  Cardiovascular:     Rate and Rhythm: Normal rate and regular rhythm.     Pulses: Normal pulses.     Heart sounds: Normal heart sounds.  Pulmonary:     Effort: Pulmonary effort is normal. No respiratory distress.     Breath sounds: Normal breath sounds.  Abdominal:     General: Bowel sounds are normal. There is no distension.     Palpations: Abdomen is soft.     Tenderness: There is no abdominal tenderness.  Musculoskeletal:     Right lower leg: No edema.     Left lower leg: No edema.     Comments: Is able to move all extremities   Lymphadenopathy:     Cervical: No cervical adenopathy.  Skin:    General: Skin is warm and dry.  Neurological:     Mental Status: She is alert. Mental status is at baseline.  Psychiatric:        Mood and Affect: Mood normal.       ASSESSMENT/ PLAN:  TODAY:    1. Persistent  atrial fibrillation: heart rate is stable: will continue asa 325 mg daily and takes lopressor 75 mg twice daily  2. DVT right lower extremity/ PE: is stable will continue asa 325 mg daily   3. Dyslipidemia LDL 78; is stable will continue lipitor 10 mg daily   4. gerd without esophagitis: is stable will continue protonix 40 mg daily   5. VIT D deficiency: vit d 28.8; will begin vit D 50,000 units weekly thru 04-01-19 will check vit D level 04-08-19.   PREVIOUS  6. Chronic  urinary retention: is stable will continue flomax 0.4 mg daily   7. Chronic blood loss anemia: stable hgb is 10.7; will continue iron daily   8. Vascular dementia without behavioral disturbance: is without change: weight is 138 (previous 136) pounds; will continue aricept 10 mg daily   9. Essential hypertension: is stable b/p 135/65: will continue norvasc 5 mg daily cozaar 50 mg daily and lopressor 75 mg twice daily    MD is aware of resident's narcotic use and is in agreement with current plan of care. We will attempt to wean resident as apropriate   Synthia Innocenteborah Elysa Womac NP [facility]  Contact [phone] Monday through Friday 8am- 5pm  After hours call [phone]

## 2019-01-25 ENCOUNTER — Encounter
Admission: RE | Admit: 2019-01-25 | Discharge: 2019-01-25 | Disposition: A | Discharge status: Home / Self Care | Payer: Medicare Other | Source: Ambulatory Visit | Attending: Internal Medicine | Admitting: Internal Medicine

## 2019-01-31 ENCOUNTER — Encounter: Payer: Self-pay | Admitting: Adult Health

## 2019-01-31 NOTE — Progress Notes (Signed)
Entered in error

## 2019-02-04 ENCOUNTER — Encounter: Payer: Self-pay | Admitting: Adult Health

## 2019-02-04 ENCOUNTER — Non-Acute Institutional Stay (SKILLED_NURSING_FACILITY): Payer: Medicare Other | Admitting: Adult Health

## 2019-02-04 DIAGNOSIS — I10 Essential (primary) hypertension: Secondary | ICD-10-CM

## 2019-02-04 DIAGNOSIS — D500 Iron deficiency anemia secondary to blood loss (chronic): Secondary | ICD-10-CM | POA: Diagnosis not present

## 2019-02-04 DIAGNOSIS — F0150 Vascular dementia without behavioral disturbance: Secondary | ICD-10-CM | POA: Diagnosis not present

## 2019-02-04 DIAGNOSIS — R339 Retention of urine, unspecified: Secondary | ICD-10-CM | POA: Diagnosis not present

## 2019-02-04 NOTE — Progress Notes (Signed)
[facility]   [facility] Room Number: 307 B Place of Service:  SNF (31)   CODE STATUS: DNR  Allergies  Allergen Reactions  . Trazodone And Nefazodone Other (See Comments)    Chief Complaint  Patient presents with  . Medical Management of Chronic Issues    Vascular dementia without behavioral disturbance; essential hypertension; chronic retention of urine; chronic blood loss anemia.     HPI:  She is a 85 year old long term resident of this facility being seen for the management of her chronic illnesses: dementia; hypertension; urine retention; anemia.  There are no reports of anxiety or agitation; no changes in appetite; her weight is stable. There are no reports of uncontrolled pain.   Past Medical History:  Diagnosis Date  . Acute retention of urine   . Anemia   . Atrial fibrillation (HCC)   . Dementia (HCC)   . DVT (deep venous thrombosis) (HCC)    on right lower extremity   . Falls frequently   . GERD (gastroesophageal reflux disease)   . Heme positive stool   . Hyperlipidemia   . Hypertension   . Lumbar spinal stenosis   . Pulmonary emboli (HCC)   . Stroke (HCC)   . Traumatic hematoma of right thoracic region     History reviewed. No pertinent surgical history.  Social History   Socioeconomic History  . Marital status: Single    Spouse name: Not on file  . Number of children: Not on file  . Years of education: Not on file  . Highest education level: Not on file  Occupational History  . Not on file  Social Needs  . Financial resource strain: Not on file  . Food insecurity:    Worry: Not on file    Inability: Not on file  . Transportation needs:    Medical: Not on file    Non-medical: Not on file  Tobacco Use  . Smoking status: Former Smoker    Last attempt to quit: 1988    Years since quitting: 32.1  . Smokeless tobacco: Never Used  Substance and Sexual Activity  . Alcohol use: Never    Frequency: Never  . Drug use:  Never  . Sexual activity: Not Currently  Lifestyle  . Physical activity:    Days per week: Not on file    Minutes per session: Not on file  . Stress: Not on file  Relationships  . Social connections:    Talks on phone: Not on file    Gets together: Not on file    Attends religious service: Not on file    Active member of club or organization: Not on file    Attends meetings of clubs or organizations: Not on file    Relationship status: Not on file  . Intimate partner violence:    Fear of current or ex partner: Not on file    Emotionally abused: Not on file    Physically abused: Not on file    Forced sexual activity: Not on file  Other Topics Concern  . Not on file  Social History Narrative   Patient is a long term resident of [address] [facility] [place]; [place]    History reviewed. No pertinent family history.    VITAL SIGNS BP 139/69   Pulse (!) 58   Temp (!) 97.5 F (36.4 C)   Resp (!) 22   Wt 145 lb 14.4 oz (66.2 kg)   SpO2 100%  Outpatient Encounter Medications as of 02/04/2019  Medication Sig  . acetaminophen (TYLENOL) 325 MG tablet Take 650 mg by mouth every 4 (four) hours as needed. for pain/ increased temp. May be administered orally, per G-tube if needed or rectally if unable to swallow (separate order). Maximum dose for 24 hours is 3,000 mg from all sources of Acetaminophen/ Tylenol  . amLODipine (NORVASC) 5 MG tablet Take 5 mg by mouth daily. Hold for Systolic < 120  . aspirin 325 MG tablet Take 325 mg by mouth daily. Hx of afib  . atorvastatin (LIPITOR) 10 MG tablet Take 10 mg by mouth at bedtime.  . calcium-vitamin D (OSCAL WITH D) 500-200 MG-UNIT tablet Take 1 tablet by mouth daily.  Marland Kitchen. donepezil (ARICEPT) 10 MG tablet Take 10 mg by mouth at bedtime.   . ferrous sulfate 325 (65 FE) MG tablet Take 325 mg by mouth daily with breakfast.   . losartan (COZAAR) 50 MG tablet Take 50 mg by mouth daily.  . magnesium hydroxide (MILK OF MAGNESIA) 400 MG/5ML  suspension Take 30 mLs by mouth every 4 (four) hours as needed for mild constipation.  . Metoprolol Tartrate 75 MG TABS Take 1 tablet by mouth 2 (two) times daily. HOLD FOR SYSTOLIC < 120  . NON FORMULARY Diet Type: Regular  . pantoprazole (PROTONIX) 40 MG tablet Take 40 mg by mouth daily.  . tamsulosin (FLOMAX) 0.4 MG CAPS capsule Take 0.4 mg by mouth daily.  Marland Kitchen. therapeutic multivitamin-minerals (THERAGRAN-M) tablet Take 1 tablet by mouth daily.  . vitamin B-12 (CYANOCOBALAMIN) 250 MCG tablet Take 250 mcg by mouth daily.  . Vitamin D, Ergocalciferol, (DRISDOL) 1.25 MG (50000 UT) CAPS capsule Take 50,000 Units by mouth every 7 (seven) days. Give every Tuesday   No facility-administered encounter medications on file as of 02/04/2019.      SIGNIFICANT DIAGNOSTIC EXAMS  LABS REVIEWED PREVIOUS:   11-21-18: wbc 5.9; hgb 10.8; hct 34.0; plt 179; bun 21; creat 0.65; k+ 4.2 na++ 140; ca 9.0 12-19-18: wbc 5.5; hgb 10.7; hct 33.7; mcv 98.;3 plt  157; glucose 83; bun 24; creat 0.73; k+ 3.7; na++ 141; ca 8.8; liver normal albumin 3.1; chol 140; ldl 78; trig 38; hdl 54; vit D 28.8  NO NEW LABS.      Review of Systems  Unable to perform ROS: Dementia (confusion )   Physical Exam Constitutional:      General: She is not in acute distress.    Appearance: She is well-developed. She is not diaphoretic.  Neck:     Musculoskeletal: Neck supple.     Thyroid: No thyromegaly.  Cardiovascular:     Rate and Rhythm: Normal rate and regular rhythm.     Pulses: Normal pulses.     Heart sounds: Normal heart sounds.  Pulmonary:     Effort: Pulmonary effort is normal. No respiratory distress.     Breath sounds: Normal breath sounds.  Abdominal:     General: Bowel sounds are normal. There is no distension.     Palpations: Abdomen is soft.     Tenderness: There is no abdominal tenderness.  Musculoskeletal:     Right lower leg: No edema.     Left lower leg: No edema.     Comments: Is able to move all  extremities   Lymphadenopathy:     Cervical: No cervical adenopathy.  Skin:    General: Skin is warm and dry.  Neurological:     Mental Status: She is alert. Mental status  is at baseline.      ASSESSMENT/ PLAN:  TODAY:    1. Chronic  urinary retention: is stable will continue flomax 0.4 mg daily   2. Chronic blood loss anemia: stable hgb is 10.7; will continue iron daily   3. Vascular dementia without behavioral disturbance: is without change: weight is 145 (previous 138) pounds; will continue aricept 10 mg daily   4. Essential hypertension: is stable b/p 139/69: will continue norvasc 5 mg daily cozaar 50 mg daily and lopressor 75 mg twice daily   PREVIOUS  5. Persistent atrial fibrillation: heart rate is stable: will continue asa 325 mg daily and takes lopressor 75 mg twice daily   6. DVT right lower extremity/ PE: is stable will continue asa 325 mg daily   7. Dyslipidemia LDL 78; is stable will continue lipitor 10 mg daily   8. gerd without esophagitis: is stable will continue protonix 40 mg daily   9. VIT D deficiency: vit d 28.8; will continue vit D 50,000 units weekly thru 04-01-19 will check vit D level 04-08-19.   Will give prevanr 13   MD is aware of resident's narcotic use and is in agreement with current plan of care. We will attempt to wean resident as apropriate   Synthia Innocent NP [facility]  Contact [phone] Monday through Friday 8am- 5pm  After hours call [phone]

## 2019-02-06 DIAGNOSIS — R339 Retention of urine, unspecified: Secondary | ICD-10-CM | POA: Insufficient documentation

## 2019-02-07 ENCOUNTER — Encounter: Payer: Self-pay | Admitting: Adult Health

## 2019-02-07 ENCOUNTER — Non-Acute Institutional Stay (SKILLED_NURSING_FACILITY): Payer: Medicare Other | Admitting: Adult Health

## 2019-02-07 DIAGNOSIS — I2782 Chronic pulmonary embolism: Secondary | ICD-10-CM

## 2019-02-07 DIAGNOSIS — I82591 Chronic embolism and thrombosis of other specified deep vein of right lower extremity: Secondary | ICD-10-CM

## 2019-02-07 DIAGNOSIS — F0150 Vascular dementia without behavioral disturbance: Secondary | ICD-10-CM

## 2019-02-07 NOTE — Progress Notes (Signed)
Location:   [facility] Room Number: 307 B Place of Service:  SNF (31)    CODE STATUS: DNR  Allergies  Allergen Reactions  . Trazodone And Nefazodone Other (See Comments)    Chief Complaint  Patient presents with  . Discharge Note    Discaharging to [facility] on 02/11/2019    HPI:  She is being discharged to [facility] as this facility is shutting down. She will continue to need long term SNF care. Her therapy; dme and medications will be provided by the receiving facility. Her medical care will be provided at the receiving facility.    Past Medical History:  Diagnosis Date  . Acute retention of urine   . Anemia   . Atrial fibrillation (HCC)   . Dementia (HCC)   . DVT (deep venous thrombosis) (HCC)    on right lower extremity   . Falls frequently   . GERD (gastroesophageal reflux disease)   . Heme positive stool   . Hyperlipidemia   . Hypertension   . Lumbar spinal stenosis   . Pulmonary emboli (HCC)   . Stroke (HCC)   . Traumatic hematoma of right thoracic region     History reviewed. No pertinent surgical history.  Social History   Socioeconomic History  . Marital status: Single    Spouse name: Not on file  . Number of children: Not on file  . Years of education: Not on file  . Highest education level: Not on file  Occupational History  . Not on file  Social Needs  . Financial resource strain: Not on file  . Food insecurity:    Worry: Not on file    Inability: Not on file  . Transportation needs:    Medical: Not on file    Non-medical: Not on file  Tobacco Use  . Smoking status: Former Smoker    Last attempt to quit: 1988    Years since quitting: 32.1  . Smokeless tobacco: Never Used  Substance and Sexual Activity  . Alcohol use: Never    Frequency: Never  . Drug use: Never  . Sexual activity: Not Currently  Lifestyle  . Physical activity:    Days per week: Not on file    Minutes per session: Not on file   . Stress: Not on file  Relationships  . Social connections:    Talks on phone: Not on file    Gets together: Not on file    Attends religious service: Not on file    Active member of club or organization: Not on file    Attends meetings of clubs or organizations: Not on file    Relationship status: Not on file  . Intimate partner violence:    Fear of current or ex partner: Not on file    Emotionally abused: Not on file    Physically abused: Not on file    Forced sexual activity: Not on file  Other Topics Concern  . Not on file  Social History Narrative   Patient is a long term resident of [address] [facility] [place]; [place] [facility]    History reviewed. No pertinent family history.  VITAL SIGNS BP (!) 148/66   Pulse 61   Temp (!) 97.5 F (36.4 C)   Resp (!) 22   Wt 145 lb 14.4 oz (66.2 kg)   SpO2 100%   Patient's Medications  New Prescriptions   No medications on file  Previous Medications   ACETAMINOPHEN (  TYLENOL) 325 MG TABLET    Take 650 mg by mouth every 4 (four) hours as needed. for pain/ increased temp. May be administered orally, per G-tube if needed or rectally if unable to swallow (separate order). Maximum dose for 24 hours is 3,000 mg from all sources of Acetaminophen/ Tylenol   AMLODIPINE (NORVASC) 5 MG TABLET    Take 5 mg by mouth daily. Hold for Systolic < 120   ASPIRIN 325 MG TABLET    Take 325 mg by mouth daily. Hx of afib   ATORVASTATIN (LIPITOR) 10 MG TABLET    Take 10 mg by mouth at bedtime.   CALCIUM-VITAMIN D (OSCAL WITH D) 500-200 MG-UNIT TABLET    Take 1 tablet by mouth daily.   DICLOFENAC SODIUM (VOLTAREN) 1 % GEL    Apply 2 g topically 3 (three) times daily. Apply to left posterior knee   DONEPEZIL (ARICEPT) 10 MG TABLET    Take 10 mg by mouth at bedtime.    FERROUS SULFATE 325 (65 FE) MG TABLET    Take 325 mg by mouth daily with breakfast.    LOSARTAN (COZAAR) 50 MG TABLET    Take 50 mg by mouth daily.   MAGNESIUM HYDROXIDE (MILK OF MAGNESIA) 400 MG/5ML  SUSPENSION    Take 30 mLs by mouth every 4 (four) hours as needed for mild constipation.   METOPROLOL TARTRATE 75 MG TABS    Take 1 tablet by mouth 2 (two) times daily. HOLD FOR SYSTOLIC < 120   NON FORMULARY    Diet Type: Regular   PANTOPRAZOLE (PROTONIX) 40 MG TABLET    Take 40 mg by mouth daily.   TAMSULOSIN (FLOMAX) 0.4 MG CAPS CAPSULE    Take 0.4 mg by mouth daily.   THERAPEUTIC MULTIVITAMIN-MINERALS (THERAGRAN-M) TABLET    Take 1 tablet by mouth daily.   VITAMIN B-12 (CYANOCOBALAMIN) 250 MCG TABLET    Take 250 mcg by mouth daily.   VITAMIN D, ERGOCALCIFEROL, (DRISDOL) 1.25 MG (50000 UT) CAPS CAPSULE    Take 50,000 Units by mouth every 7 (seven) days. Give every Tuesday  Modified Medications   No medications on file  Discontinued Medications   No medications on file     SIGNIFICANT DIAGNOSTIC EXAMS  LABS REVIEWED PREVIOUS:   11-21-18: wbc 5.9; hgb 10.8; hct 34.0; plt 179; bun 21; creat 0.65; k+ 4.2 na++ 140; ca 9.0 12-19-18: wbc 5.5; hgb 10.7; hct 33.7; mcv 98.;3 plt  157; glucose 83; bun 24; creat 0.73; k+ 3.7; na++ 141; ca 8.8; liver normal albumin 3.1; chol 140; ldl 78; trig 38; hdl 54; vit D 28.8  NO NEW LABS.      Review of Systems  Unable to perform ROS: Dementia (confusion)    Physical Exam Constitutional:      General: She is not in acute distress.    Appearance: She is well-developed. She is not diaphoretic.  Neck:     Musculoskeletal: Neck supple.     Thyroid: No thyromegaly.  Cardiovascular:     Rate and Rhythm: Normal rate and regular rhythm.     Pulses: Normal pulses.     Heart sounds: Normal heart sounds.  Pulmonary:     Effort: Pulmonary effort is normal. No respiratory distress.     Breath sounds: Normal breath sounds.  Abdominal:     General: Bowel sounds are normal. There is no distension.     Palpations: Abdomen is soft.     Tenderness: There is no abdominal tenderness.  Musculoskeletal:     Right lower leg: No edema.     Left lower leg: No  edema.     Comments: Is able to move all extremities   Lymphadenopathy:     Cervical: No cervical adenopathy.  Skin:    General: Skin is warm and dry.  Neurological:     Mental Status: She is alert. Mental status is at baseline.  Psychiatric:        Mood and Affect: Mood normal.      ASSESSMENT/ PLAN:   Patient is being discharged with the following home health services:  None needed   Patient is being discharged with the following durable medical equipment: none needed     Patient has been advised to f/u with their PCP in 1-2 weeks to bring them up to date on their rehab stay.  Social services at facility was responsible for arranging this appointment.  Pt was provided with a 30 day supply of prescriptions for medications and refills must be obtained from their PCP.  For controlled substances, a more limited supply may be provided adequate until PCP appointment only.   Her dme; therapy and medications will be provided by the receiving facility her medical care will be provided at the receiving facility.   Synthia Innocenteborah Green NP [facility]  Contact [phone] Monday through Friday 8am- 5pm  After hours call [phone]

## 2019-03-04 ENCOUNTER — Emergency Department (HOSPITAL_COMMUNITY): Payer: Medicare Other

## 2019-03-04 ENCOUNTER — Encounter (HOSPITAL_COMMUNITY): Payer: Self-pay | Admitting: Emergency Medicine

## 2019-03-04 ENCOUNTER — Other Ambulatory Visit: Payer: Self-pay

## 2019-03-04 ENCOUNTER — Emergency Department (HOSPITAL_COMMUNITY)
Admission: EM | Admit: 2019-03-04 | Discharge: 2019-03-04 | Disposition: A | Discharge status: Home / Self Care | Payer: Medicare Other | Attending: Emergency Medicine | Admitting: Emergency Medicine

## 2019-03-04 DIAGNOSIS — I639 Cerebral infarction, unspecified: Secondary | ICD-10-CM | POA: Diagnosis not present

## 2019-03-04 DIAGNOSIS — Z79899 Other long term (current) drug therapy: Secondary | ICD-10-CM | POA: Insufficient documentation

## 2019-03-04 DIAGNOSIS — I4891 Unspecified atrial fibrillation: Secondary | ICD-10-CM | POA: Insufficient documentation

## 2019-03-04 DIAGNOSIS — Z87891 Personal history of nicotine dependence: Secondary | ICD-10-CM | POA: Diagnosis not present

## 2019-03-04 DIAGNOSIS — E785 Hyperlipidemia, unspecified: Secondary | ICD-10-CM | POA: Insufficient documentation

## 2019-03-04 DIAGNOSIS — R531 Weakness: Secondary | ICD-10-CM | POA: Diagnosis present

## 2019-03-04 DIAGNOSIS — I10 Essential (primary) hypertension: Secondary | ICD-10-CM | POA: Insufficient documentation

## 2019-03-04 DIAGNOSIS — Z86711 Personal history of pulmonary embolism: Secondary | ICD-10-CM | POA: Diagnosis not present

## 2019-03-04 LAB — COMPREHENSIVE METABOLIC PANEL
ALT: 12 U/L (ref 0–44)
AST: 21 U/L (ref 15–41)
Albumin: 3.4 g/dL — ABNORMAL LOW (ref 3.5–5.0)
Alkaline Phosphatase: 50 U/L (ref 38–126)
Anion gap: 6 (ref 5–15)
BUN: 16 mg/dL (ref 8–23)
CO2: 28 mmol/L (ref 22–32)
Calcium: 9.4 mg/dL (ref 8.9–10.3)
Chloride: 107 mmol/L (ref 98–111)
Creatinine, Ser: 0.93 mg/dL (ref 0.44–1.00)
GFR calc Af Amer: >60 mL/min (ref >60)
GFR calc non Af Amer: 55 mL/min — ABNORMAL LOW (ref >60)
Glucose, Bld: 82 mg/dL (ref 70–99)
POTASSIUM: 3.7 mmol/L (ref 3.5–5.1)
Sodium: 141 mmol/L (ref 135–145)
Total Bilirubin: 1.3 mg/dL — ABNORMAL HIGH (ref 0.3–1.2)
Total Protein: 6.3 g/dL — ABNORMAL LOW (ref 6.5–8.1)

## 2019-03-04 LAB — APTT: APTT: 27 s (ref 24–36)

## 2019-03-04 LAB — ETHANOL: Alcohol, Ethyl (B): <10 mg/dL (ref <10)

## 2019-03-04 LAB — DIFFERENTIAL
Abs Immature Granulocytes: 0.02 10*3/uL (ref 0.00–0.07)
BASOS ABS: 0 10*3/uL (ref 0.0–0.1)
Basophils Relative: 1 %
EOS PCT: 3 %
Eosinophils Absolute: 0.2 10*3/uL (ref 0.0–0.5)
Immature Granulocytes: 0 %
Lymphocytes Relative: 20 %
Lymphs Abs: 1.4 10*3/uL (ref 0.7–4.0)
Monocytes Absolute: 0.6 10*3/uL (ref 0.1–1.0)
Monocytes Relative: 9 %
NEUTROS PCT: 67 %
Neutro Abs: 5 10*3/uL (ref 1.7–7.7)

## 2019-03-04 LAB — URINALYSIS, ROUTINE W REFLEX MICROSCOPIC
Bilirubin Urine: NEGATIVE
Glucose, UA: NEGATIVE mg/dL
Hgb urine dipstick: NEGATIVE
Ketones, ur: NEGATIVE mg/dL
Leukocytes,Ua: NEGATIVE
Nitrite: NEGATIVE
Protein, ur: 30 mg/dL — AB
Specific Gravity, Urine: 1.026 (ref 1.005–1.030)
pH: 6 (ref 5.0–8.0)

## 2019-03-04 LAB — RAPID URINE DRUG SCREEN, HOSP PERFORMED
Amphetamines: NOT DETECTED
Barbiturates: NOT DETECTED
Benzodiazepines: NOT DETECTED
Cocaine: NOT DETECTED
OPIATES: NOT DETECTED
Tetrahydrocannabinol: NOT DETECTED

## 2019-03-04 LAB — CBC
HCT: 38.6 % (ref 36.0–46.0)
Hemoglobin: 12.4 g/dL (ref 12.0–15.0)
MCH: 31.5 pg (ref 26.0–34.0)
MCHC: 32.1 g/dL (ref 30.0–36.0)
MCV: 98 fL (ref 80.0–100.0)
NRBC: 0 % (ref 0.0–0.2)
Platelets: 190 10*3/uL (ref 150–400)
RBC: 3.94 MIL/uL (ref 3.87–5.11)
RDW: 13 % (ref 11.5–15.5)
WBC: 7.3 10*3/uL (ref 4.0–10.5)

## 2019-03-04 LAB — PROTIME-INR
INR: 1.1 (ref 0.8–1.2)
Prothrombin Time: 13.7 seconds (ref 11.4–15.2)

## 2019-03-04 NOTE — ED Notes (Signed)
Izora Ribas contact: [phone] [facility] daughter)

## 2019-03-04 NOTE — ED Notes (Signed)
Patient transported to MRI 

## 2019-03-04 NOTE — ED Provider Notes (Signed)
[facility] EMERGENCY DEPARTMENT Provider Note   CSN: [id number] Arrival date & time: 03/04/19  1137    History   Chief Complaint No chief complaint on file.   HPI Sylvia Montoya is a 85 y.o. female.     Patient is an 85 year old female with a history of A. fib, PE, hypertension, hyperlipidemia, stroke, spinal stenosis that has left her wheelchair-bound who lives in the facility presenting today with right upper extremity weakness.  Patient states that yesterday she started noticing the weakness in her right arm but now she is unable to feed herself with her right arm due to the weakness.  She denies any pain in the arm but states it does feel funny.  She has not had headache or neck pain.  She denies any falls or injury.  Facility reports that since yesterday she has been unable to get out of bed and into her wheelchair which is abnormal for her.  Her prior stroke has left her with right-sided weakness but she states today is much worse.  She has no speech issues, issues with swallowing, shortness of breath, chest pain or abdominal pain.  She has not had cough or fever.  The history is provided by the patient.    Past Medical History:  Diagnosis Date  . Acute retention of urine   . Anemia   . Atrial fibrillation (HCC)   . Dementia (HCC)   . DVT (deep venous thrombosis) (HCC)    on right lower extremity   . Falls frequently   . GERD (gastroesophageal reflux disease)   . Heme positive stool   . Hyperlipidemia   . Hypertension   . Lumbar spinal stenosis   . Pulmonary emboli (HCC)   . Stroke (HCC)   . Traumatic hematoma of right thoracic region     Patient Active Problem List   Diagnosis Date Noted  . Chronic retention of urine 02/06/2019  . Essential hypertension 12/09/2018  . Persistent atrial fibrillation 12/09/2018  . Chronic deep vein thrombosis (DVT) of right lower extremity (HCC) 12/09/2018  . Pulmonary emboli (HCC) 12/09/2018  . Dyslipidemia  12/09/2018  . GERD without esophagitis 12/09/2018  . Vascular dementia without behavioral disturbance (HCC) 12/09/2018  . Chronic blood loss anemia 12/09/2018  . Physical deconditioning 12/09/2018    History reviewed. No pertinent surgical history.   OB History   No obstetric history on file.      Home Medications    Prior to Admission medications   Medication Sig Start Date End Date Taking? Authorizing Provider  acetaminophen (TYLENOL) 325 MG tablet Take 650 mg by mouth every 4 (four) hours as needed. for pain/ increased temp. May be administered orally, per G-tube if needed or rectally if unable to swallow (separate order). Maximum dose for 24 hours is 3,000 mg from all sources of Acetaminophen/ Tylenol 12/01/18   [provider]  amLODipine (NORVASC) 5 MG tablet Take 5 mg by mouth daily. Hold for Systolic < 120 12/23/18   [provider]  aspirin 325 MG tablet Take 325 mg by mouth daily. Hx of afib 11/27/18   [provider]  atorvastatin (LIPITOR) 10 MG tablet Take 10 mg by mouth at bedtime. 11/27/18   [provider]  calcium-vitamin D (OSCAL WITH D) 500-200 MG-UNIT tablet Take 1 tablet by mouth daily. 11/27/18   [provider]  diclofenac sodium (VOLTAREN) 1 % GEL Apply 2 g topically 3 (three) times daily. Apply to left posterior knee 02/04/19  [provider]  donepezil (ARICEPT) 10 MG tablet Take 10 mg by mouth at bedtime.  11/27/18   [provider]  ferrous sulfate 325 (65 FE) MG tablet Take 325 mg by mouth daily with breakfast.  11/27/18   [provider]  losartan (COZAAR) 50 MG tablet Take 50 mg by mouth daily. 11/27/18   [provider]  magnesium hydroxide (MILK OF MAGNESIA) 400 MG/5ML suspension Take 30 mLs by mouth every 4 (four) hours as needed for mild constipation. 01/04/19   [provider]  Metoprolol Tartrate 75 MG TABS Take 1 tablet by mouth 2 (two) times daily. HOLD FOR SYSTOLIC <  120 12/23/18   [provider]  NON FORMULARY Diet Type: Regular    [provider]  pantoprazole (PROTONIX) 40 MG tablet Take 40 mg by mouth daily. 11/27/18   [provider]  tamsulosin (FLOMAX) 0.4 MG CAPS capsule Take 0.4 mg by mouth daily. 11/27/18   [provider]  therapeutic multivitamin-minerals (THERAGRAN-M) tablet Take 1 tablet by mouth daily.    [provider]  vitamin B-12 (CYANOCOBALAMIN) 250 MCG tablet Take 250 mcg by mouth daily. 11/27/18   [provider]  Vitamin D, Ergocalciferol, (DRISDOL) 1.25 MG (50000 UT) CAPS capsule Take 50,000 Units by mouth every 7 (seven) days. Give every Tuesday 01/07/19 04/08/19  [provider]    Family History No family history on file.  Social History Social History   Tobacco Use  . Smoking status: Former Smoker    Last attempt to quit: 1988    Years since quitting: 32.2  . Smokeless tobacco: Never Used  Substance Use Topics  . Alcohol use: Never    Frequency: Never  . Drug use: Never     Allergies   Trazodone and nefazodone   Review of Systems Review of Systems  All other systems reviewed and are negative.    Physical Exam Updated Vital Signs BP (!) 145/73   Pulse (!) 57   Temp 98.9 F (37.2 C) (Oral)   Resp 17   Ht  (1.626 m)   Wt 63.1 kg   SpO2 99%   BMI 23.89 kg/m   Physical Exam Vitals signs and nursing note reviewed.  Constitutional:      General: She is not in acute distress.    Appearance: She is well-developed.  HENT:     Head: Normocephalic and atraumatic.  Eyes:     Pupils: Pupils are equal, round, and reactive to light.  Cardiovascular:     Rate and Rhythm: Normal rate and regular rhythm.     Heart sounds: Normal heart sounds. No murmur. No friction rub.  Pulmonary:     Effort: Pulmonary effort is normal.     Breath sounds: Normal breath sounds. No wheezing or rales.  Abdominal:     General: Bowel sounds are normal. There is  no distension.     Palpations: Abdomen is soft.     Tenderness: There is no abdominal tenderness. There is no guarding or rebound.  Musculoskeletal: Normal range of motion.        General: No tenderness.     Comments: No edema  Skin:    General: Skin is warm and dry.     Findings: No rash.  Neurological:     Mental Status: She is alert and oriented to person, place, and time.     Cranial Nerves: No cranial nerve deficit.     Sensory: Sensory deficit present.  Motor: Weakness present.     Comments: RUE 3/5 strength noted in the right upper extremity and 4 out of 5 strength noted in the left upper extremity.  contractures in the bilateral lower ext.  Sensation decreased in the RUE.  No numbness of the face or facial droop.  Speech clear  Psychiatric:        Behavior: Behavior normal.      ED Treatments / Results  Labs (all labs ordered are listed, but only abnormal results are displayed) Labs Reviewed  COMPREHENSIVE METABOLIC PANEL - Abnormal; Notable for the following components:      Result Value   Total Protein 6.3 (*)    Albumin 3.4 (*)    Total Bilirubin 1.3 (*)    GFR calc non Af Amer 55 (*)    All other components within normal limits  ETHANOL  PROTIME-INR  APTT  CBC  DIFFERENTIAL  RAPID URINE DRUG SCREEN, HOSP PERFORMED  URINALYSIS, ROUTINE W REFLEX MICROSCOPIC    EKG EKG Interpretation  Date/Time:  Tuesday March 04 2019 12:01:56 EDT Ventricular Rate:  54 PR Interval:    QRS Duration: 106 QT Interval:  484 QTC Calculation: 459 R Axis:   -27 Text Interpretation:  Sinus rhythm Abnormal R-wave progression, early transition Left ventricular hypertrophy No significant change since last tracing Confirmed by Gwyneth Sprout ([id number]) on 03/04/2019 12:06:18 PM   Radiology Ct Head Wo Contrast  Result Date: 03/04/2019 CLINICAL DATA:  85 year old female unable to get out of bed into wheelchair (not normal for patient). History of strokes with baseline right-sided  weakness. Initial encounter. EXAM: CT HEAD WITHOUT CONTRAST TECHNIQUE: Contiguous axial images were obtained from the base of the skull through the vertex without intravenous contrast. COMPARISON:  12/28/2008 MR. 12/27/2008 CT. FINDINGS: Brain: No intracranial hemorrhage or CT evidence of large acute infarct. Remote infarct medial aspect left frontal-parietal lobe. Remote small cerebellar infarcts. Remote left caudate and left basal ganglia infarct. Prominent chronic microvascular changes. Global atrophy. No intracranial mass lesion noted on this unenhanced exam. Vascular: Vascular calcifications.  No acute hyperdense vessel. Skull: Hyperostosis frontalis interna. Sinuses/Orbits: No acute orbital abnormality. Visualized paranasal sinuses are clear. Other: Mastoid air cells and middle ear cavities are clear. IMPRESSION: 1. No intracranial hemorrhage or CT evidence of large acute infarct. 2. Remote infarct medial aspect left frontal-parietal lobe. Remote small cerebellar infarcts. Remote left caudate and left basal ganglia infarct. 3. Prominent chronic microvascular changes. 4. Global atrophy. Electronically Signed   By: Lacy Duverney M.D.   On: 03/04/2019 13:28    Procedures Procedures (including critical care time)  Medications Ordered in ED Medications - No data to display   Initial Impression / Assessment and Plan / ED Course  I have reviewed the triage vital signs and the nursing notes.  Pertinent labs & imaging results that were available during my care of the patient were reviewed by me and considered in my medical decision making (see chart for details).        Elderly female presenting from facility today with right-sided weakness.  Patient states since yesterday her right arm has been feeling more weak than normal.  She is now unable to feed herself with her right arm in facility states that she is unable to get out of bed into her wheelchair which is not normal.  She denies any speech or  swallowing problems.  She denies any systemic symptoms such as shortness of breath, chest pain, abdominal pain, vomiting, cough shortness of breath.  She has no lower extremity edema.  On exam she is unable to lift her arm more than 3 or 4 inches off the bed and it takes significant effort.  She does not have full range of motion of her left arm either but is able to pick it up much more easily.  She does not have function of her lower extremities so is difficult to assess if there is weakness.  Low suspicion for dissection, pneumonia or infectious etiology of her symptoms.  She denies any trauma.  Concern for stroke at this time.  Stroke order set initiated.  EKG within normal limits and patient's vital signs are reassuring  1:57 PM Labs are all within normal limits.  Head CT with chronic changes but no acute findings.  Will do MRI to further evaluate.  UA is still pending.  We will also get MRI of cervical spine to ensure that is not the cause of her symptoms as it seems to be more localized to the right upper extremity.  Pt checked out ot Dr. Charm Barges  Final Clinical Impressions(s) / ED Diagnoses   Final diagnoses:  None    ED Discharge Orders    None       Gwyneth Sprout, MD 03/07/19 [phone]

## 2019-03-04 NOTE — Discharge Instructions (Addendum)
Follow-up with your primary care doctor and neurology as discussed.

## 2019-03-04 NOTE — ED Notes (Signed)
PTAR called by angela RN

## 2019-03-04 NOTE — ED Notes (Signed)
Patient verbalizes understanding of discharge instructions. Opportunity for questioning and answers were provided. Armband removed by staff, pt discharged from ED. PTAR given paperwork. RN attempted to call [facility] with no answer.

## 2019-03-04 NOTE — ED Notes (Signed)
PTAR called @ 1850-per Megan, RN-called by Marylene Land

## 2019-03-04 NOTE — ED Provider Notes (Addendum)
Assumed care of patient at 4 PM by Dr. Anitra Lauth.  Patient with right-sided weakness however has history of stroke with weakness in both legs and right arm.  Increasing weakness of the right upper extremity.  Thus far work-up overall unremarkable.  Awaiting MRI of brain and cervical spine to look for any reasons for weakness other than chronic decompensation.  Lab work overall unremarkable.  Okay for discharge if no acute findings on imaging.  Patient lives at a facility.  Patient with MRI that shows acute left parietal stroke and left caudate stroke.  Discussed case with Dr. Otelia Limes.  Patient is on aspirin.  She used to be on Xarelto and Coumadin for atrial fibrillation but had GI bleed and other bleeding and was taken off of it.  She has been on aspirin for the last 3 years due to these concerns in the past.  Her primary care doctor in conjunction with her other doctors made this decision.  After talking with Dr. Otelia Limes after talking with family overall patient is clinically doing well.  There is likely no further optimization medically for her.  She is likely having strokes due to paroxysmal A. Fib/embolic source.  She is not an anticoagulant candidate.  Dr. Otelia Limes says Plavix is not going to improve her symptoms/change her stroke prevention much and that aspirin is likely just as efficient of a medicine for her.  Patient is a DO NOT RESUSCITATE, does have comfort measures in place if she were to get very sick. She lives at nursing facility that takes care of her full ADLs, she is demented at baseline. At this time she appears mostly at her baseline.  Shared decision was made to discharge patient back to her facility and follow-up outpatient with her primary care doctor and neurology.  At this time patient would likely not benefit from an admission for stroke work-up as previously discussed that she is not a candidate for anticoagulation.  Patient remained hemodynamically stable throughout my care and was  discharged in good condition.  This chart was dictated using voice recognition software.  Despite best efforts to proofread,  errors can occur which can change the documentation meaning.   .Critical Care Performed by: Virgina Norfolk, DO Authorized by: Virgina Norfolk, DO   Critical care provider statement:    Critical care time (minutes):  45   Critical care was necessary to treat or prevent imminent or life-threatening deterioration of the following conditions:  CNS failure or compromise   Critical care was time spent personally by me on the following activities:  Blood draw for specimens, development of treatment plan with patient or surrogate, discussions with consultants, examination of patient, obtaining history from patient or surrogate, ordering and performing treatments and interventions, ordering and review of laboratory studies, ordering and review of radiographic studies, pulse oximetry, re-evaluation of patient's condition and review of old charts   I assumed direction of critical care for this patient from another provider in my specialty: no       This chart was dictated using voice recognition software.  Despite best efforts to proofread,  errors can occur which can change the documentation meaning.     Virgina Norfolk, DO 03/04/19 1832    Virgina Norfolk, DO 03/04/19 [phone]

## 2019-03-04 NOTE — ED Notes (Signed)
Patient transported to CT 

## 2019-03-04 NOTE — ED Triage Notes (Signed)
[facility]- pt from [facility]. Since yesterday pt was unable to get out of bed into her wheelchair which is not normal for her. Pt has history of strokes and has right sided weakness at baseline. Pt is A&Ox4 per EMS.    136/74 56 HR CBG 136

## 2021-04-28 ENCOUNTER — Emergency Department (HOSPITAL_COMMUNITY)
Admission: EM | Admit: 2021-04-28 | Discharge: 2021-04-29 | Disposition: A | Discharge status: Home / Self Care | Payer: Medicare Other | Attending: Emergency Medicine | Admitting: Emergency Medicine

## 2021-04-28 ENCOUNTER — Emergency Department (HOSPITAL_COMMUNITY): Payer: Medicare Other

## 2021-04-28 DIAGNOSIS — Z8673 Personal history of transient ischemic attack (TIA), and cerebral infarction without residual deficits: Secondary | ICD-10-CM | POA: Diagnosis not present

## 2021-04-28 DIAGNOSIS — Z23 Encounter for immunization: Secondary | ICD-10-CM | POA: Insufficient documentation

## 2021-04-28 DIAGNOSIS — S0083XA Contusion of other part of head, initial encounter: Secondary | ICD-10-CM | POA: Insufficient documentation

## 2021-04-28 DIAGNOSIS — I10 Essential (primary) hypertension: Secondary | ICD-10-CM | POA: Diagnosis not present

## 2021-04-28 DIAGNOSIS — R001 Bradycardia, unspecified: Secondary | ICD-10-CM | POA: Insufficient documentation

## 2021-04-28 DIAGNOSIS — Z7982 Long term (current) use of aspirin: Secondary | ICD-10-CM | POA: Insufficient documentation

## 2021-04-28 DIAGNOSIS — F0390 Unspecified dementia without behavioral disturbance: Secondary | ICD-10-CM | POA: Insufficient documentation

## 2021-04-28 DIAGNOSIS — Z87891 Personal history of nicotine dependence: Secondary | ICD-10-CM | POA: Insufficient documentation

## 2021-04-28 DIAGNOSIS — W19XXXA Unspecified fall, initial encounter: Secondary | ICD-10-CM

## 2021-04-28 DIAGNOSIS — Z79899 Other long term (current) drug therapy: Secondary | ICD-10-CM | POA: Insufficient documentation

## 2021-04-28 DIAGNOSIS — Y92129 Unspecified place in nursing home as the place of occurrence of the external cause: Secondary | ICD-10-CM | POA: Diagnosis not present

## 2021-04-28 DIAGNOSIS — W050XXA Fall from non-moving wheelchair, initial encounter: Secondary | ICD-10-CM | POA: Diagnosis not present

## 2021-04-28 DIAGNOSIS — S0993XA Unspecified injury of face, initial encounter: Secondary | ICD-10-CM | POA: Diagnosis present

## 2021-04-28 LAB — BASIC METABOLIC PANEL
Anion gap: 4 — ABNORMAL LOW (ref 5–15)
BUN: 17 mg/dL (ref 8–23)
CO2: 29 mmol/L (ref 22–32)
Calcium: 9 mg/dL (ref 8.9–10.3)
Chloride: 104 mmol/L (ref 98–111)
Creatinine, Ser: 0.62 mg/dL (ref 0.44–1.00)
GFR, Estimated: >60 mL/min (ref >60)
Glucose, Bld: 91 mg/dL (ref 70–99)
Potassium: 3.7 mmol/L (ref 3.5–5.1)
Sodium: 137 mmol/L (ref 135–145)

## 2021-04-28 LAB — CBC WITH DIFFERENTIAL/PLATELET
Abs Immature Granulocytes: 0.03 10*3/uL (ref 0.00–0.07)
Basophils Absolute: 0 10*3/uL (ref 0.0–0.1)
Basophils Relative: 1 %
Eosinophils Absolute: 0.2 10*3/uL (ref 0.0–0.5)
Eosinophils Relative: 3 %
HCT: 32 % — ABNORMAL LOW (ref 36.0–46.0)
Hemoglobin: 10.3 g/dL — ABNORMAL LOW (ref 12.0–15.0)
Immature Granulocytes: 1 %
Lymphocytes Relative: 20 %
Lymphs Abs: 1.3 10*3/uL (ref 0.7–4.0)
MCH: 33.1 pg (ref 26.0–34.0)
MCHC: 32.2 g/dL (ref 30.0–36.0)
MCV: 102.9 fL — ABNORMAL HIGH (ref 80.0–100.0)
Monocytes Absolute: 0.6 10*3/uL (ref 0.1–1.0)
Monocytes Relative: 9 %
Neutro Abs: 4.3 10*3/uL (ref 1.7–7.7)
Neutrophils Relative %: 66 %
Platelets: 206 10*3/uL (ref 150–400)
RBC: 3.11 MIL/uL — ABNORMAL LOW (ref 3.87–5.11)
RDW: 13 % (ref 11.5–15.5)
WBC: 6.5 10*3/uL (ref 4.0–10.5)
nRBC: 0 % (ref 0.0–0.2)

## 2021-04-28 MED ORDER — ACETAMINOPHEN 325 MG PO TABS
650.0000 mg | ORAL_TABLET | Freq: Once | ORAL | Status: AC
  Administered 2021-04-28: 650 mg via ORAL
  Filled 2021-04-28: qty 2

## 2021-04-28 MED ORDER — TETANUS-DIPHTH-ACELL PERTUSSIS 5-2.5-18.5 LF-MCG/0.5 IM SUSY
0.5000 mL | PREFILLED_SYRINGE | Freq: Once | INTRAMUSCULAR | Status: AC
  Administered 2021-04-28: 0.5 mL via INTRAMUSCULAR
  Filled 2021-04-28: qty 0.5

## 2021-04-28 NOTE — ED Notes (Signed)
EMS wrapped sheet around pt's pelvis for a pelvic binding. C-collar also in place by EMS upon arrival to [facility] ED

## 2021-04-28 NOTE — ED Notes (Signed)
RN performed wound care to laceration on R side of pt's forehead from fall.

## 2021-04-28 NOTE — ED Provider Notes (Signed)
[facility] EMERGENCY DEPARTMENT Provider Note   CSN: [id number] Arrival date & time: 04/28/21  2116     History Chief Complaint  Patient presents with  . Fall    Sylvia Montoya is a 87 y.o. female past medical history of vascular dementia, stroke with residual right-sided deficit, persistent atrial fibrillation, presenting from [facility] and rehab after unwitnessed fall.  Patient tells me she was sitting in her wheelchair, trying to reach her walking stick when she fell out of her wheelchair and struck her head.  States she landed on her right side.  She is not having much pain at all, except in her right arm when she moves it.  She is not complaining of neck or back pain or headache.  Per EMS, patient is at her mental baseline regarding dementia.  She is not anticoagulated.  She arrives with MOST form at bedside. The history is provided by the patient.       Past Medical History:  Diagnosis Date  . Acute retention of urine   . Anemia   . Atrial fibrillation (HCC)   . Dementia (HCC)   . DVT (deep venous thrombosis) (HCC)    on right lower extremity   . Falls frequently   . GERD (gastroesophageal reflux disease)   . Heme positive stool   . Hyperlipidemia   . Hypertension   . Lumbar spinal stenosis   . Pulmonary emboli (HCC)   . Stroke (HCC)   . Traumatic hematoma of right thoracic region     Patient Active Problem List   Diagnosis Date Noted  . Chronic retention of urine 02/06/2019  . Essential hypertension 12/09/2018  . Persistent atrial fibrillation (HCC) 12/09/2018  . Chronic deep vein thrombosis (DVT) of right lower extremity (HCC) 12/09/2018  . Pulmonary emboli (HCC) 12/09/2018  . Dyslipidemia 12/09/2018  . GERD without esophagitis 12/09/2018  . Vascular dementia without behavioral disturbance (HCC) 12/09/2018  . Chronic blood loss anemia 12/09/2018  . Physical deconditioning 12/09/2018    No past surgical history on file.   OB History    No obstetric history on file.     No family history on file.  Social History   Tobacco Use  . Smoking status: Former Smoker    Quit date: 1988    Years since quitting: 34.3  . Smokeless tobacco: Never Used  Vaping Use  . Vaping Use: Never used  Substance Use Topics  . Alcohol use: Never  . Drug use: Never    Home Medications Prior to Admission medications   Medication Sig Start Date End Date Taking? Authorizing Provider  acetaminophen (TYLENOL) 325 MG tablet Take 650 mg by mouth 3 (three) times daily.  12/01/18   [provider]  amLODipine (NORVASC) 5 MG tablet Take 5 mg by mouth daily.  12/23/18   [provider]  aspirin 325 MG tablet Take 325 mg by mouth daily. Hx of afib 11/27/18   [provider]  atorvastatin (LIPITOR) 10 MG tablet Take 10 mg by mouth at bedtime. 11/27/18   [provider]  calcium-vitamin D (OSCAL WITH D) 500-200 MG-UNIT tablet Take 1 tablet by mouth daily. 11/27/18   [provider]  donepezil (ARICEPT) 10 MG tablet Take 10 mg by mouth at bedtime.  11/27/18   [provider]  ferrous sulfate 325 (65 FE) MG tablet Take 325 mg by mouth daily with breakfast.  11/27/18   [provider]  lidocaine (LMX) 4 % cream  Apply 1 application topically 3 (three) times daily. Left knee    [provider]  losartan (COZAAR) 50 MG tablet Take 50 mg by mouth daily. 11/27/18   [provider]  metoprolol tartrate (LOPRESSOR) 25 MG tablet Take 1 tablet by mouth 2 (two) times daily.  12/23/18   [provider]  pantoprazole (PROTONIX) 40 MG tablet Take 40 mg by mouth daily. 11/27/18   [provider]  tamsulosin (FLOMAX) 0.4 MG CAPS capsule Take 0.4 mg by mouth daily. 11/27/18   [provider]  vitamin B-12 (CYANOCOBALAMIN) 250 MCG tablet Take 250 mcg by mouth daily. 11/27/18   [provider]    Allergies    Trazodone and nefazodone  Review of Systems   Review  of Systems  All other systems reviewed and are negative.   Physical Exam Updated Vital Signs BP (!) 147/77   Pulse (!) 58   Temp 97.6 F (36.4 C) (Oral)   Resp 12   Ht 5\' 4"  (1.626 m)   Wt 55.3 kg   SpO2 99%   BMI 20.94 kg/m   Physical Exam Vitals and nursing note reviewed.  Constitutional:      Appearance: She is well-developed.  HENT:     Head: Normocephalic.     Comments: Hematoma to right forehead with small abrasion, not actively bleeding.  No other obvious head or face injury Eyes:     Extraocular Movements: Extraocular movements intact.     Conjunctiva/sclera: Conjunctivae normal.     Pupils: Pupils are equal, round, and reactive to light.  Neck:     Comments: C-collar in place Cardiovascular:     Rate and Rhythm: Bradycardia present.  Pulmonary:     Effort: Pulmonary effort is normal. No respiratory distress.     Breath sounds: Normal breath sounds.  Abdominal:     General: Bowel sounds are normal.     Palpations: Abdomen is soft.     Tenderness: There is no abdominal tenderness.  Musculoskeletal:     Comments: Pelvis is stable. There is a sheet tied around pelvis as binder per EMS.  Right hip is able to range with both rotation and flexion. Patient has no pain with this. No shortening. No obvious deformity to the right shoulder.  Pain with movement of the right shoulder.no palpable deformity to the humerus or forearm, no obvious deformity to lower arm  Skin:    General: Skin is warm.  Neurological:     Mental Status: She is alert.     Comments: Speech is fluent  Psychiatric:        Behavior: Behavior normal.     ED Results / Procedures / Treatments   Labs (all labs ordered are listed, but only abnormal results are displayed) Labs Reviewed  BASIC METABOLIC PANEL - Abnormal; Notable for the following components:      Result Value   Anion gap 4 (*)    All other components within normal limits  CBC WITH DIFFERENTIAL/PLATELET - Abnormal; Notable for the  following components:   RBC 3.11 (*)    Hemoglobin 10.3 (*)    HCT 32.0 (*)    MCV 102.9 (*)    All other components within normal limits    EKG None  Radiology DG Chest 1 View  Result Date: 04/28/2021 CLINICAL DATA:  Status post fall. EXAM: CHEST  1 VIEW COMPARISON:  December 27, 2008 FINDINGS: Diffuse, chronic appearing increased interstitial lung markings are seen. Mild atelectasis and/or early infiltrate  is noted within the retrocardiac region of the left lung base. There is no evidence of a pleural effusion or pneumothorax. The heart size and mediastinal contours are within normal limits. Marked severity calcification of the thoracic aorta is seen. A radiopaque inferior vena cava filter is noted within the visualized portion of the abdomen. Marked severity chronic and degenerative changes are suspected involving the right shoulder. IMPRESSION: Chronic appearing increased lung markings with mild left basilar atelectasis and/or infiltrate. Electronically Signed   By: Aram Candela M.D.   On: 04/28/2021 22:58   DG Clavicle Right  Result Date: 04/28/2021 CLINICAL DATA:  Status post fall. EXAM: RIGHT CLAVICLE - 2+ VIEWS COMPARISON:  None. FINDINGS: There is no evidence of an acute fracture. Chronic changes are seen involving the right humeral head. Marked severity degenerative changes are also seen involving the right glenohumeral articulation with chronic appearing dorsal subluxation of the right humeral head with respect to the right glenoid. Soft tissues are unremarkable. IMPRESSION: Chronic and degenerative changes, without an acute osseous abnormality. Electronically Signed   By: Aram Candela M.D.   On: 04/28/2021 22:55   DG Shoulder Right  Result Date: 04/28/2021 CLINICAL DATA:  Status post fall. EXAM: RIGHT SHOULDER - 2+ VIEW COMPARISON:  None. FINDINGS: There is no evidence of an acute fracture. Chronic changes are seen involving the distal right clavicle and right humeral head.  Marked severity degenerative changes are also seen involving the right glenohumeral articulation. There is marked severity, chronic appearing superior subluxation of the right humeral head with respect to the right glenoid. Soft tissues are unremarkable. IMPRESSION: Marked severity chronic and degenerative changes without evidence of an acute osseous abnormality. MRI correlation is recommended. Electronically Signed   By: Aram Candela M.D.   On: 04/28/2021 23:06   CT Head Wo Contrast  Result Date: 04/28/2021 CLINICAL DATA:  Head trauma unwitnessed fall EXAM: CT HEAD WITHOUT CONTRAST CT CERVICAL SPINE WITHOUT CONTRAST TECHNIQUE: Multidetector CT imaging of the head and cervical spine was performed following the standard protocol without intravenous contrast. Multiplanar CT image reconstructions of the cervical spine were also generated. COMPARISON:  MRI 03/04/2019, CT 03/04/2019 FINDINGS: CT HEAD FINDINGS Brain: No acute territorial infarction, hemorrhage, or intracranial mass. Advanced atrophy. Extensive white matter hypodensity consistent with chronic small vessel ischemic change. Multifocal chronic infarcts involving the left posterior frontal and parietal lobes. Chronic lacunar infarcts within the right cerebellum and bilateral basal ganglia. Stable ventricle size. Vascular: No hyperdense vessels.  Carotid vascular calcification Skull: Normal. Negative for fracture or focal lesion. Sinuses/Orbits: No acute finding. Other: Small right forehead scalp hematoma CT CERVICAL SPINE FINDINGS Alignment: Mild reversal of cervical lordosis. No subluxation. Facet alignment within normal limits. Skull base and vertebrae: No acute fracture. No primary bone lesion or focal pathologic process. Soft tissues and spinal canal: No prevertebral fluid or swelling. No visible canal hematoma. Disc levels: Multiple level degenerative change. Moderate severe disc space narrowing and degenerative change C3 through C7. Facet  degenerative changes at multiple levels with foraminal stenosis. Upper chest: Negative. Other: None IMPRESSION: 1. No CT evidence for acute intracranial abnormality. Atrophy and chronic small vessel ischemic changes of the white matter. Multifocal chronic infarcts. 2. Mild reversal of cervical lordosis with degenerative changes. No acute osseous abnormality. Electronically Signed   By: Jasmine Pang M.D.   On: 04/28/2021 22:49   CT Cervical Spine Wo Contrast  Result Date: 04/28/2021 CLINICAL DATA:  Head trauma unwitnessed fall EXAM: CT HEAD WITHOUT CONTRAST CT CERVICAL SPINE  WITHOUT CONTRAST TECHNIQUE: Multidetector CT imaging of the head and cervical spine was performed following the standard protocol without intravenous contrast. Multiplanar CT image reconstructions of the cervical spine were also generated. COMPARISON:  MRI 03/04/2019, CT 03/04/2019 FINDINGS: CT HEAD FINDINGS Brain: No acute territorial infarction, hemorrhage, or intracranial mass. Advanced atrophy. Extensive white matter hypodensity consistent with chronic small vessel ischemic change. Multifocal chronic infarcts involving the left posterior frontal and parietal lobes. Chronic lacunar infarcts within the right cerebellum and bilateral basal ganglia. Stable ventricle size. Vascular: No hyperdense vessels.  Carotid vascular calcification Skull: Normal. Negative for fracture or focal lesion. Sinuses/Orbits: No acute finding. Other: Small right forehead scalp hematoma CT CERVICAL SPINE FINDINGS Alignment: Mild reversal of cervical lordosis. No subluxation. Facet alignment within normal limits. Skull base and vertebrae: No acute fracture. No primary bone lesion or focal pathologic process. Soft tissues and spinal canal: No prevertebral fluid or swelling. No visible canal hematoma. Disc levels: Multiple level degenerative change. Moderate severe disc space narrowing and degenerative change C3 through C7. Facet degenerative changes at multiple  levels with foraminal stenosis. Upper chest: Negative. Other: None IMPRESSION: 1. No CT evidence for acute intracranial abnormality. Atrophy and chronic small vessel ischemic changes of the white matter. Multifocal chronic infarcts. 2. Mild reversal of cervical lordosis with degenerative changes. No acute osseous abnormality. Electronically Signed   By: Jasmine Pang M.D.   On: 04/28/2021 22:49   DG Hip Unilat With Pelvis 2-3 Views Right  Result Date: 04/28/2021 CLINICAL DATA:  Status post fall. EXAM: DG HIP (WITH OR WITHOUT PELVIS) 2-3V RIGHT COMPARISON:  None. FINDINGS: The right lower extremity is rotated and subsequently limited in evaluation. There is no evidence of an acute hip fracture or dislocation. Moderate to marked severity degenerative changes are seen in the form of joint space narrowing and acetabular sclerosis. Bilateral pedicle screws are seen within the lower lumbar spine. Marked severity vascular calcification is noted. IMPRESSION: Limited study, as described above, without evidence of acute osseous abnormality. CT correlation is recommended, as an occult fracture involving the right femoral neck cannot be excluded. Electronically Signed   By: Aram Candela M.D.   On: 04/28/2021 23:03    Procedures Procedures   Medications Ordered in ED Medications  Tdap (BOOSTRIX) injection 0.5 mL (0.5 mLs Intramuscular Given 04/28/21 2346)  acetaminophen (TYLENOL) tablet 650 mg (650 mg Oral Given 04/28/21 2343)    ED Course  I have reviewed the triage vital signs and the nursing notes.  Pertinent labs & imaging results that were available during my care of the patient were reviewed by me and considered in my medical decision making (see chart for details).    MDM Rules/Calculators/A&P                          Patient is presenting from nursing facility after unwitnessed fall out of her wheelchair.  Patient states she was reaching for her walking stick when she fell from her chair onto the  floor.  She fell onto her right side.  She struck her right head without suspected loss of consciousness.  She is at her mental baseline regarding dementia.  She is not anticoagulated.  There was concern by EMS for deformity to her right shoulder and hip, however I believe this is just positional based on patient's position in the bed.  I removed the sheet applied to the pelvis and the hip is ranging without difficulty or pain.  There  is no obvious deformity.  She initially has pain in the right shoulder with range of motion, however on reevaluation she is able to range the shoulder much more easily without pain.  Range of motion is limited though this is likely due to the chronic significant arthritis per imaging results.  Do not believe CT imaging of the hip is necessary. Patient is able to range the hip without pain. CT head is negative for bleed, C-spine shows no acute injury.  She remains at her mental baseline and is in no acute distress.  Wound is irrigated and does not require closure.  Tdap updated.   Discussed workup and findings with attending physician Dr. Fredderick Phenix, who is in agreement with workup and care plan for discharge.   Discussed findings with family and plan for discharge back to facility. All are in agreement. Pt discharged in no distress.    Final Clinical Impression(s) / ED Diagnoses Final diagnoses:  Fall at nursing home, initial encounter  Contusion of face, initial encounter    Rx / DC Orders ED Discharge Orders    None       Graham Hyun, [place] N, PA-C 04/29/21 0006    Rolan Bucco, MD 04/29/21 1534

## 2021-04-28 NOTE — ED Triage Notes (Signed)
Pt bib [facility] from [facility] after unwitnessed fall when transferring from wheelchair approx 1 hour ago. Unknown LOC. [facility] reports pt is at baseline mental status. Hx of dementia. EMS reports deformity to r shoulder and r hip. No blood thinners.   BP: 135/70  Spo2: 99% RA  HR: 58  CBG: 198

## 2021-04-29 NOTE — ED Notes (Signed)
E-signature pad unavailable at time of pt discharge. This RN discussed discharge materials with pt and answered all pt questions. Pt stated understanding of discharge material. ? ?

## 2021-04-29 NOTE — ED Notes (Signed)
[facility] called  

## 2021-04-29 NOTE — Discharge Instructions (Addendum)
You can apply a thin layer of antibiotic ointment such as bacitracin or Neosporin to her forehead.  Keep her wound clean and covered. She can have Tylenol every 6 hours as needed for pain.  Apply ice to areas of pain for 20 minutes at a time. Return to the ED as needed for any sudden changes in mental status, vomiting, or other concerning symptoms.

## 2021-08-17 ENCOUNTER — Emergency Department (HOSPITAL_COMMUNITY)
Admission: EM | Admit: 2021-08-17 | Discharge: 2021-08-18 | Disposition: A | Discharge status: Home / Self Care | Payer: Medicare Other | Attending: Emergency Medicine | Admitting: Emergency Medicine

## 2021-08-17 ENCOUNTER — Emergency Department (HOSPITAL_COMMUNITY): Payer: Medicare Other

## 2021-08-17 ENCOUNTER — Encounter (HOSPITAL_COMMUNITY): Payer: Self-pay | Admitting: Emergency Medicine

## 2021-08-17 DIAGNOSIS — F0150 Vascular dementia without behavioral disturbance: Secondary | ICD-10-CM | POA: Diagnosis not present

## 2021-08-17 DIAGNOSIS — R4182 Altered mental status, unspecified: Secondary | ICD-10-CM | POA: Insufficient documentation

## 2021-08-17 DIAGNOSIS — I10 Essential (primary) hypertension: Secondary | ICD-10-CM | POA: Insufficient documentation

## 2021-08-17 DIAGNOSIS — Z79899 Other long term (current) drug therapy: Secondary | ICD-10-CM | POA: Insufficient documentation

## 2021-08-17 DIAGNOSIS — Z66 Do not resuscitate: Secondary | ICD-10-CM | POA: Diagnosis not present

## 2021-08-17 DIAGNOSIS — Z7982 Long term (current) use of aspirin: Secondary | ICD-10-CM | POA: Insufficient documentation

## 2021-08-17 DIAGNOSIS — Z87891 Personal history of nicotine dependence: Secondary | ICD-10-CM | POA: Diagnosis not present

## 2021-08-17 DIAGNOSIS — F0390 Unspecified dementia without behavioral disturbance: Secondary | ICD-10-CM

## 2021-08-17 DIAGNOSIS — W19XXXA Unspecified fall, initial encounter: Secondary | ICD-10-CM | POA: Diagnosis not present

## 2021-08-17 LAB — URINALYSIS, ROUTINE W REFLEX MICROSCOPIC
Bilirubin Urine: NEGATIVE
Glucose, UA: NEGATIVE mg/dL
Hgb urine dipstick: NEGATIVE
Ketones, ur: NEGATIVE mg/dL
Leukocytes,Ua: NEGATIVE
Nitrite: POSITIVE — AB
Protein, ur: NEGATIVE mg/dL
Specific Gravity, Urine: 1.01 (ref 1.005–1.030)
pH: 5 (ref 5.0–8.0)

## 2021-08-17 LAB — CBC WITH DIFFERENTIAL/PLATELET
Abs Immature Granulocytes: 0.03 10*3/uL (ref 0.00–0.07)
Basophils Absolute: 0.1 10*3/uL (ref 0.0–0.1)
Basophils Relative: 1 %
Eosinophils Absolute: 0.2 10*3/uL (ref 0.0–0.5)
Eosinophils Relative: 2 %
HCT: 38.5 % (ref 36.0–46.0)
Hemoglobin: 12.4 g/dL (ref 12.0–15.0)
Immature Granulocytes: 0 %
Lymphocytes Relative: 21 %
Lymphs Abs: 1.4 10*3/uL (ref 0.7–4.0)
MCH: 32.4 pg (ref 26.0–34.0)
MCHC: 32.2 g/dL (ref 30.0–36.0)
MCV: 100.5 fL — ABNORMAL HIGH (ref 80.0–100.0)
Monocytes Absolute: 0.6 10*3/uL (ref 0.1–1.0)
Monocytes Relative: 9 %
Neutro Abs: 4.5 10*3/uL (ref 1.7–7.7)
Neutrophils Relative %: 67 %
Platelets: 215 10*3/uL (ref 150–400)
RBC: 3.83 MIL/uL — ABNORMAL LOW (ref 3.87–5.11)
RDW: 13.2 % (ref 11.5–15.5)
WBC: 6.7 10*3/uL (ref 4.0–10.5)
nRBC: 0 % (ref 0.0–0.2)

## 2021-08-17 LAB — COMPREHENSIVE METABOLIC PANEL
ALT: 9 U/L (ref 0–44)
AST: 16 U/L (ref 15–41)
Albumin: 3.3 g/dL — ABNORMAL LOW (ref 3.5–5.0)
Alkaline Phosphatase: 43 U/L (ref 38–126)
Anion gap: 8 (ref 5–15)
BUN: 13 mg/dL (ref 8–23)
CO2: 26 mmol/L (ref 22–32)
Calcium: 9.4 mg/dL (ref 8.9–10.3)
Chloride: 106 mmol/L (ref 98–111)
Creatinine, Ser: 0.6 mg/dL (ref 0.44–1.00)
GFR, Estimated: >60 mL/min (ref >60)
Glucose, Bld: 92 mg/dL (ref 70–99)
Potassium: 4 mmol/L (ref 3.5–5.1)
Sodium: 140 mmol/L (ref 135–145)
Total Bilirubin: 1.1 mg/dL (ref 0.3–1.2)
Total Protein: 6.4 g/dL — ABNORMAL LOW (ref 6.5–8.1)

## 2021-08-17 NOTE — ED Triage Notes (Signed)
Pt BIB [facility] from [facility], staff reports pt normally bed bound, but A&Ox4, state they heard her talking with her roommate earlier in the day, when they went to check on her, she was somnolent, not responding to questions. Pt following some commands, tracking with her eyes. EMS VSS.

## 2021-08-17 NOTE — ED Notes (Signed)
Pt moved to room at this time 

## 2021-08-17 NOTE — Discharge Instructions (Addendum)
It was our pleasure to provide your ER care today - we hope that you feel better.  Drink plenty of fluids/stay well hydrated.   Follow up with primary care doctor in the coming week.   Return to ER if worse, new symptoms, fevers, new/severe pain, trouble breathing, or other concern.

## 2021-08-17 NOTE — ED Notes (Signed)
Called [facility] for transport to [facility]

## 2021-08-17 NOTE — ED Provider Notes (Signed)
[facility] EMERGENCY DEPARTMENT Provider Note   CSN: [id number] Arrival date & time: 08/17/21  1517     History Chief Complaint  Patient presents with   Altered Mental Status    Sylvia Montoya is a 87 y.o. female.  HPI She presents for evaluation of altered mental status.  She is unable to give any history.  She arrives by EMS from her care facility.  Apparently earlier today, the patient was conversing with her roommate, later she was sleepy and not responding to questions.  Apparently she was sent here for altered mental status.  Patient has history of dementia.  She has end-of-life directives indicating comfort measures, with as needed antibiotics and IV fluids.  Additional history from patient's daughter, Sylvia Montoya medications, obtained at 3:40 PM-she states that she was called at 630 this morning when her mother was found on the floor of the facility.  Apparently she was put back in bed.  Patient is normally bedbound.  Later today, the patient was felt to be less responsive, and they became concerned that an injury was causing this so she was sent to the ED.  Patient has dementia at baseline.  Her daughter was wondering if she may be had a fractured arm or leg that was causing discomfort, and making her less responsive.  Level 5 caveat-dementia    Past Medical History:  Diagnosis Date   Acute retention of urine    Anemia    Atrial fibrillation (HCC)    Dementia (HCC)    DVT (deep venous thrombosis) (HCC)    on right lower extremity    Falls frequently    GERD (gastroesophageal reflux disease)    Heme positive stool    Hyperlipidemia    Hypertension    Lumbar spinal stenosis    Pulmonary emboli (HCC)    Stroke [facility])    Traumatic hematoma of right thoracic region     Patient Active Problem List   Diagnosis Date Noted   Chronic retention of urine 02/06/2019   Essential hypertension 12/09/2018   Persistent atrial fibrillation (HCC) 12/09/2018    Chronic deep vein thrombosis (DVT) of right lower extremity (HCC) 12/09/2018   Pulmonary emboli (HCC) 12/09/2018   Dyslipidemia 12/09/2018   GERD without esophagitis 12/09/2018   Vascular dementia without behavioral disturbance (HCC) 12/09/2018   Chronic blood loss anemia 12/09/2018   Physical deconditioning 12/09/2018    History reviewed. No pertinent surgical history.   OB History   No obstetric history on file.     No family history on file.  Social History   Tobacco Use   Smoking status: Former    Types: Cigarettes    Quit date: 1988    Years since quitting: 34.6   Smokeless tobacco: Never  Vaping Use   Vaping Use: Never used  Substance Use Topics   Alcohol use: Never   Drug use: Never    Home Medications Prior to Admission medications   Medication Sig Start Date End Date Taking? Authorizing Provider  acetaminophen (TYLENOL) 325 MG tablet Take 650 mg by mouth 3 (three) times daily.  12/01/18   [provider]  amLODipine (NORVASC) 5 MG tablet Take 5 mg by mouth daily.  12/23/18   [provider]  aspirin 325 MG tablet Take 325 mg by mouth daily. Hx of afib 11/27/18   [provider]  atorvastatin (LIPITOR) 10 MG tablet Take 10 mg by mouth at bedtime. 11/27/18   [provider]  calcium-vitamin  D (OSCAL WITH D) 500-200 MG-UNIT tablet Take 1 tablet by mouth daily. 11/27/18   [provider]  donepezil (ARICEPT) 10 MG tablet Take 10 mg by mouth at bedtime.  11/27/18   [provider]  ferrous sulfate 325 (65 FE) MG tablet Take 325 mg by mouth daily with breakfast.  11/27/18   [provider]  lidocaine (LMX) 4 % cream Apply 1 application topically 3 (three) times daily. Left knee    [provider]  losartan (COZAAR) 50 MG tablet Take 50 mg by mouth daily. 11/27/18   [provider]  metoprolol tartrate (LOPRESSOR) 25 MG tablet Take 1 tablet by mouth 2 (two) times daily.  12/23/18   [provider]  pantoprazole (PROTONIX) 40 MG tablet Take 40 mg by mouth daily. 11/27/18   [provider]  tamsulosin (FLOMAX) 0.4 MG CAPS capsule Take 0.4 mg by mouth daily. 11/27/18   [provider]  vitamin B-12 (CYANOCOBALAMIN) 250 MCG tablet Take 250 mcg by mouth daily. 11/27/18   [provider]    Allergies    Trazodone and nefazodone  Review of Systems   Review of Systems  Unable to perform ROS: Dementia   Physical Exam Updated Vital Signs There were no vitals taken for this visit.  Physical Exam Vitals and nursing note reviewed.  Constitutional:      General: She is not in acute distress.    Appearance: She is well-developed. She is not ill-appearing or diaphoretic.     Comments: Elderly, frail  HENT:     Head: Normocephalic and atraumatic.     Right Ear: External ear normal.     Left Ear: External ear normal.     Nose: No congestion.     Mouth/Throat:     Pharynx: No oropharyngeal exudate or posterior oropharyngeal erythema.  Eyes:     Conjunctiva/sclera: Conjunctivae normal.     Pupils: Pupils are equal, round, and reactive to light.  Neck:     Trachea: Phonation normal.  Cardiovascular:     Rate and Rhythm: Normal rate and regular rhythm.     Heart sounds: Normal heart sounds.  Pulmonary:     Effort: Pulmonary effort is normal. No respiratory distress.     Breath sounds: Normal breath sounds. No stridor.  Chest:     Chest wall: No tenderness.  Abdominal:     General: There is no distension.     Palpations: Abdomen is soft.     Tenderness: There is no abdominal tenderness. There is no guarding.  Musculoskeletal:        General: No swelling or tenderness.     Cervical back: Normal range of motion and neck supple.     Comments: No evident large joint deformities or localized tenderness of the arms or legs.  Skin:    General: Skin is warm and dry.  Neurological:     Mental Status: She is alert.     Cranial Nerves: No cranial  nerve deficit.     Motor: No abnormal muscle tone.     Coordination: Coordination normal.     Comments: Mild flexion contractures of both legs at the knees.  Upper extremities fairly mobile, with good grip strength of the hands.  She does not follow commands to elevate either arms or legs.    ED Results / Procedures / Treatments   Labs (all labs ordered are listed, but only abnormal results are displayed) Labs Reviewed - No data to display  EKG None  Radiology No results found.  Procedures Procedures   Medications Ordered in ED Medications - No data to display  ED Course  I have reviewed the triage vital signs and the nursing notes.  Pertinent labs & imaging results that were available during my care of the patient were reviewed by me and considered in my medical decision making (see chart for details).    MDM Rules/Calculators/A&P                            Patient Vitals for the past 24 hrs:  BP Pulse Resp SpO2  08/17/21 1530 (!) 165/78 (!) 57 12 96 %    Medical Decision Making:  This patient is presenting for evaluation of possible injuries from fall, which does require a range of treatment options, and is a complaint that involves a moderate risk of morbidity and mortality. The differential diagnoses include contusion, fracture, spine injury, intracranial injury. I decided to review old records, and in summary elderly female, with a MOST form indicating comfort measures with fluids and antibiotic if needed.  She is unable to give any history.  I obtained additional historical information from her daughter by telephone.  Clinical Laboratory Tests Ordered, included CBC, Metabolic panel, and Urinalysis. Radiologic Tests Ordered, included CT head, chest x-ray, pelvic x-ray.     Critical Interventions-clinical evaluation, laboratory testing, radiography, observation   CRITICAL CARE-no Performed by: Mancel Bale  Nursing Notes Reviewed/ Care  Coordinated Applicable Imaging Reviewed Interpretation of Laboratory Data incorporated into ED treatment  Plan disposition by oncoming team after return of testing.  I suspect that she can return to her nursing care facility.    Final Clinical Impression(s) / ED Diagnoses Final diagnoses:  Fall, initial encounter  Altered mental status, unspecified altered mental status type  DNR (do not resuscitate)    Rx / DC Orders ED Discharge Orders     None        Mancel Bale, MD 08/17/21 1549

## 2021-08-17 NOTE — ED Notes (Signed)
Pt started yelling and moaning at this time and stating help me help me. When asked the pt what was wrong pt stated that her back end was hurting. Pt advised I could turn her and get her off of it. Pt turned to lt side at this time

## 2021-08-17 NOTE — ED Notes (Signed)
Received verbal report from Crystal B RN at this time 

## 2021-08-17 NOTE — ED Provider Notes (Signed)
Signed out by Dr Effie Shy pt with advanced dementia at baseline, to d/c to home if imaging neg acute.   Imaging negative for acute process.   Pt appears awake, content, no acute distress. Pt afebrile. Vitals stable.   Pt currently appears stable for d/c per Dr Mable Paris plan.      Cathren Laine, MD 08/17/21 [phone]

## 2021-08-18 NOTE — ED Notes (Signed)
Repositioned pt to right side with pillows and rolled blankets underneath left hip and knee for comfort.

## 2021-08-18 NOTE — ED Notes (Signed)
Report attempted to [facility] x2

## 2021-12-26 ENCOUNTER — Emergency Department (HOSPITAL_COMMUNITY): Payer: Medicare Other

## 2021-12-26 ENCOUNTER — Other Ambulatory Visit: Payer: Self-pay

## 2021-12-26 ENCOUNTER — Emergency Department: Payer: Medicare Other

## 2021-12-26 ENCOUNTER — Emergency Department (HOSPITAL_COMMUNITY)
Admission: EM | Admit: 2021-12-26 | Discharge: 2021-12-27 | Disposition: A | Discharge status: Home / Self Care | Payer: Medicare Other | Attending: Emergency Medicine | Admitting: Emergency Medicine

## 2021-12-26 DIAGNOSIS — Y92129 Unspecified place in nursing home as the place of occurrence of the external cause: Secondary | ICD-10-CM | POA: Diagnosis not present

## 2021-12-26 DIAGNOSIS — I10 Essential (primary) hypertension: Secondary | ICD-10-CM | POA: Insufficient documentation

## 2021-12-26 DIAGNOSIS — I4891 Unspecified atrial fibrillation: Secondary | ICD-10-CM | POA: Insufficient documentation

## 2021-12-26 DIAGNOSIS — F0390 Unspecified dementia without behavioral disturbance: Secondary | ICD-10-CM | POA: Insufficient documentation

## 2021-12-26 DIAGNOSIS — M25512 Pain in left shoulder: Secondary | ICD-10-CM | POA: Diagnosis present

## 2021-12-26 DIAGNOSIS — W19XXXA Unspecified fall, initial encounter: Secondary | ICD-10-CM | POA: Diagnosis not present

## 2021-12-26 DIAGNOSIS — Z7982 Long term (current) use of aspirin: Secondary | ICD-10-CM | POA: Diagnosis not present

## 2021-12-26 DIAGNOSIS — Z79899 Other long term (current) drug therapy: Secondary | ICD-10-CM | POA: Diagnosis not present

## 2021-12-26 DIAGNOSIS — M79605 Pain in left leg: Secondary | ICD-10-CM | POA: Insufficient documentation

## 2021-12-26 LAB — COMPREHENSIVE METABOLIC PANEL
ALT: 10 U/L (ref 0–44)
AST: 16 U/L (ref 15–41)
Albumin: 3.3 g/dL — ABNORMAL LOW (ref 3.5–5.0)
Alkaline Phosphatase: 43 U/L (ref 38–126)
Anion gap: 6 (ref 5–15)
BUN: 17 mg/dL (ref 8–23)
CO2: 29 mmol/L (ref 22–32)
Calcium: 9.5 mg/dL (ref 8.9–10.3)
Chloride: 102 mmol/L (ref 98–111)
Creatinine, Ser: 0.66 mg/dL (ref 0.44–1.00)
GFR, Estimated: >60 mL/min (ref >60)
Glucose, Bld: 101 mg/dL — ABNORMAL HIGH (ref 70–99)
Potassium: 4.1 mmol/L (ref 3.5–5.1)
Sodium: 137 mmol/L (ref 135–145)
Total Bilirubin: 1.1 mg/dL (ref 0.3–1.2)
Total Protein: 6.5 g/dL (ref 6.5–8.1)

## 2021-12-26 LAB — CBC WITH DIFFERENTIAL/PLATELET
Abs Immature Granulocytes: 0.03 10*3/uL (ref 0.00–0.07)
Basophils Absolute: 0.1 10*3/uL (ref 0.0–0.1)
Basophils Relative: 1 %
Eosinophils Absolute: 0.2 10*3/uL (ref 0.0–0.5)
Eosinophils Relative: 3 %
HCT: 37.9 % (ref 36.0–46.0)
Hemoglobin: 12.1 g/dL (ref 12.0–15.0)
Immature Granulocytes: 0 %
Lymphocytes Relative: 24 %
Lymphs Abs: 1.7 10*3/uL (ref 0.7–4.0)
MCH: 31.8 pg (ref 26.0–34.0)
MCHC: 31.9 g/dL (ref 30.0–36.0)
MCV: 99.7 fL (ref 80.0–100.0)
Monocytes Absolute: 0.7 10*3/uL (ref 0.1–1.0)
Monocytes Relative: 9 %
Neutro Abs: 4.5 10*3/uL (ref 1.7–7.7)
Neutrophils Relative %: 63 %
Platelets: 210 10*3/uL (ref 150–400)
RBC: 3.8 MIL/uL — ABNORMAL LOW (ref 3.87–5.11)
RDW: 13.4 % (ref 11.5–15.5)
WBC: 7.2 10*3/uL (ref 4.0–10.5)
nRBC: 0 % (ref 0.0–0.2)

## 2021-12-26 NOTE — ED Notes (Signed)
Pt to ct 

## 2021-12-26 NOTE — ED Notes (Signed)
[facility] called for pt - family given update

## 2021-12-26 NOTE — ED Triage Notes (Signed)
Pt BIB EMS from [facility]. Pt had an unwitnessed fall around 2 hours ago - complaining of left shoulder pain - no thinners  Pt has hx of dementia at baseline  Most form with pt  VS with EMS  172 palp P 60  RR 16  CBG 130

## 2021-12-26 NOTE — ED Provider Notes (Signed)
[facility] EMERGENCY DEPARTMENT Provider Note   CSN: [id number] Arrival date & time: 12/26/21  1914     History  Chief Complaint  Patient presents with   Sylvia Montoya is a 88 y.o. female with past medical history significant for atrial fibrillation, dementia, HTN, prior stroke who presents after a fall.  The patient reportedly had an unwitnessed fall at her facility and was found out of bed.  The patient states that she fell onto her left side.  She is unable to provide any other details about the fall.  She is complaining of pain in her left shoulder and left leg.  Home Medications Prior to Admission medications   Medication Sig Start Date End Date Taking? Authorizing Provider  acetaminophen (TYLENOL) 500 MG tablet Take 1,000 mg by mouth 2 (two) times daily.   Yes [provider]  amLODipine (NORVASC) 5 MG tablet Take 5 mg by mouth every morning. 12/23/18  Yes [provider]  aspirin 325 MG tablet Take 325 mg by mouth every morning. 11/27/18  Yes [provider]  atorvastatin (LIPITOR) 10 MG tablet Take 10 mg by mouth at bedtime. 11/27/18  Yes [provider]  cholecalciferol (VITAMIN D3) 25 MCG (1000 UNIT) tablet Take 1,000 Units by mouth every morning.   Yes [provider]  citalopram (CELEXA) 40 MG tablet Take 40 mg by mouth every morning.   Yes [provider]  cloNIDine (CATAPRES) 0.2 MG tablet Take 0.2 mg by mouth 2 (two) times daily.   Yes [provider]  donepezil (ARICEPT) 10 MG tablet Take 10 mg by mouth at bedtime.  11/27/18  Yes [provider]  Ensure (ENSURE) Take 90 mLs by mouth 2 (two) times daily between meals.   Yes [provider]  famotidine (PEPCID) 20 MG tablet Take 20 mg by mouth at bedtime.   Yes [provider]  hydrOXYzine (ATARAX) 10 MG tablet Take 10 mg by mouth at bedtime.   Yes [provider]  losartan (COZAAR) 50 MG tablet Take  50 mg by mouth every morning. 11/27/18  Yes [provider]  metoprolol tartrate (LOPRESSOR) 25 MG tablet Take 75 mg by mouth 2 (two) times daily. 12/23/18  Yes [provider]      Allergies    Trazodone and nefazodone    Review of Systems   Review of Systems  Respiratory:  Negative for shortness of breath.   Cardiovascular:  Negative for chest pain.  Gastrointestinal:  Negative for nausea.  Genitourinary:  Negative for pelvic pain.  Musculoskeletal:  Positive for arthralgias. Negative for back pain and neck pain.  Skin:  Negative for wound.  Neurological:  Negative for numbness.   Physical Exam Updated Vital Signs BP (!) 150/89    Pulse 72    Temp (!) 97.4 F (36.3 C) (Oral)    Resp 15    Ht 5\' 4"  (1.626 m)    SpO2 100%    BMI 20.94 kg/m  Physical Exam Vitals and nursing note reviewed.  Constitutional:      General: She is not in acute distress.    Appearance: She is well-developed.  HENT:     Head: Normocephalic and atraumatic.     Right Ear: External ear normal.     Left Ear: External ear normal.     Nose: Nose normal.     Mouth/Throat:     Pharynx: Oropharynx is clear.  Eyes:  Extraocular Movements: Extraocular movements intact.     Conjunctiva/sclera: Conjunctivae normal.     Pupils: Pupils are equal, round, and reactive to light.  Cardiovascular:     Rate and Rhythm: Normal rate and regular rhythm.     Pulses: Normal pulses.     Heart sounds: Normal heart sounds. No murmur heard. Pulmonary:     Effort: Pulmonary effort is normal. No respiratory distress.     Breath sounds: Normal breath sounds.  Abdominal:     Palpations: Abdomen is soft.     Tenderness: There is no abdominal tenderness.  Musculoskeletal:        General: Tenderness present. No swelling, deformity or signs of injury.     Cervical back: Neck supple.     Right lower leg: No edema.     Left lower leg: No edema.     Comments: Tenderness to palpation of the left shoulder and  entire left leg.  Pelvis is stable to AP and lateral compression without significant tenderness.  No midline neck or back tenderness.  No obvious deformities or injuries.  Skin:    General: Skin is warm and dry.     Capillary Refill: Capillary refill takes less than 2 seconds.  Neurological:     General: No focal deficit present.     Mental Status: She is alert. Mental status is at baseline.  Psychiatric:        Mood and Affect: Mood normal.    ED Results / Procedures / Treatments   Labs (all labs ordered are listed, but only abnormal results are displayed) Labs Reviewed  CBC WITH DIFFERENTIAL/PLATELET - Abnormal; Notable for the following components:      Result Value   RBC 3.80 (*)    All other components within normal limits  COMPREHENSIVE METABOLIC PANEL - Abnormal; Notable for the following components:   Glucose, Bld 101 (*)    Albumin 3.3 (*)    All other components within normal limits    EKG EKG Interpretation  Date/Time:  Monday December 26 2021 19:37:45 EST Ventricular Rate:  62 PR Interval:  58 QRS Duration: 105 QT Interval:  443 QTC Calculation: 450 R Axis:   -43 Text Interpretation: Sinus rhythm Ventricular premature complex Short PR interval Abnormal R-wave progression, early transition Left ventricular hypertrophy Interpretation limited secondary to artifact otherwise similar to Mar 2020 Confirmed by Sherwood Gambler [phone]) on 12/26/2021 8:02:11 PM  Radiology CT Head Wo Contrast  Result Date: 12/26/2021 CLINICAL DATA:  Head trauma, minor (Age >= 65y); Neck trauma (Age >= 65y). Unwitnessed fall EXAM: CT HEAD WITHOUT CONTRAST CT CERVICAL SPINE WITHOUT CONTRAST TECHNIQUE: Multidetector CT imaging of the head and cervical spine was performed following the standard protocol without intravenous contrast. Multiplanar CT image reconstructions of the cervical spine were also generated. COMPARISON:  CT head and C-spine 08/17/2021 FINDINGS: CT HEAD FINDINGS BRAIN: BRAIN  Cerebral ventricle sizes are concordant with the degree of cerebral volume loss. Patchy and confluent areas of decreased attenuation are noted throughout the deep and periventricular white matter of the cerebral hemispheres bilaterally, compatible with chronic microvascular ischemic disease. Similar-appearing encephalomalacia of the left frontal convexity. No evidence of large-territorial acute infarction. No parenchymal hemorrhage. No mass lesion. No extra-axial collection. No mass effect or midline shift. No hydrocephalus. Basilar cisterns are patent. Vascular: No hyperdense vessel. Atherosclerotic calcifications are present within the cavernous internal carotid arteries. Skull: No acute fracture or focal lesion. Sinuses/Orbits: Paranasal sinuses and mastoid air cells are clear. Bilateral lens replacement.  Otherwise the orbits are unremarkable. Other: None. CT CERVICAL SPINE FINDINGS Alignment: Grade 1 anterolisthesis of C2 on C3. Straightening of normal cervical lordosis likely due to positioning and degenerative changes. Skull base and vertebrae: Multilevel severe degenerative changes of the spine. No acute fracture. No aggressive appearing focal osseous lesion or focal pathologic process. Soft tissues and spinal canal: No prevertebral fluid or swelling. No visible canal hematoma. Upper chest: Biapical pleural/pulmonary scarring. Other: Atherosclerotic plaque. IMPRESSION: 1. No acute intracranial abnormality. 2. No acute displaced fracture or traumatic listhesis of the cervical spine. Electronically Signed   By: Iven Finn M.D.   On: 12/26/2021 22:12   CT Cervical Spine Wo Contrast  Result Date: 12/26/2021 CLINICAL DATA:  Head trauma, minor (Age >= 65y); Neck trauma (Age >= 65y). Unwitnessed fall EXAM: CT HEAD WITHOUT CONTRAST CT CERVICAL SPINE WITHOUT CONTRAST TECHNIQUE: Multidetector CT imaging of the head and cervical spine was performed following the standard protocol without intravenous contrast.  Multiplanar CT image reconstructions of the cervical spine were also generated. COMPARISON:  CT head and C-spine 08/17/2021 FINDINGS: CT HEAD FINDINGS BRAIN: BRAIN Cerebral ventricle sizes are concordant with the degree of cerebral volume loss. Patchy and confluent areas of decreased attenuation are noted throughout the deep and periventricular white matter of the cerebral hemispheres bilaterally, compatible with chronic microvascular ischemic disease. Similar-appearing encephalomalacia of the left frontal convexity. No evidence of large-territorial acute infarction. No parenchymal hemorrhage. No mass lesion. No extra-axial collection. No mass effect or midline shift. No hydrocephalus. Basilar cisterns are patent. Vascular: No hyperdense vessel. Atherosclerotic calcifications are present within the cavernous internal carotid arteries. Skull: No acute fracture or focal lesion. Sinuses/Orbits: Paranasal sinuses and mastoid air cells are clear. Bilateral lens replacement. Otherwise the orbits are unremarkable. Other: None. CT CERVICAL SPINE FINDINGS Alignment: Grade 1 anterolisthesis of C2 on C3. Straightening of normal cervical lordosis likely due to positioning and degenerative changes. Skull base and vertebrae: Multilevel severe degenerative changes of the spine. No acute fracture. No aggressive appearing focal osseous lesion or focal pathologic process. Soft tissues and spinal canal: No prevertebral fluid or swelling. No visible canal hematoma. Upper chest: Biapical pleural/pulmonary scarring. Other: Atherosclerotic plaque. IMPRESSION: 1. No acute intracranial abnormality. 2. No acute displaced fracture or traumatic listhesis of the cervical spine. Electronically Signed   By: Iven Finn M.D.   On: 12/26/2021 22:12   DG Pelvis Portable  Result Date: 12/26/2021 CLINICAL DATA:  Un witnessed fall 2 hours ago EXAM: PORTABLE PELVIS 1-2 VIEWS COMPARISON:  08/17/2021 FINDINGS: Single frontal view of the pelvis  includes both hips. Bones are osteopenic. No acute displaced fracture. Symmetrical hip osteoarthritis. Stable postsurgical changes lumbosacral junction. IMPRESSION: 1. Severe osteopenia limiting evaluation. 2. No displaced fracture. 3. Stable osteoarthritis. Electronically Signed   By: Randa Ngo M.D.   On: 12/26/2021 21:30   DG Chest Portable 1 View  Result Date: 12/26/2021 CLINICAL DATA:  Un witnessed fall 2 hours ago, left shoulder pain EXAM: PORTABLE CHEST 1 VIEW COMPARISON:  08/17/2021 FINDINGS: Single frontal view of the chest demonstrates a stable cardiac silhouette. Chronic scarring and fibrosis throughout the lungs without focal consolidation, effusion, or pneumothorax. No acute displaced fracture. IVC filter unchanged. IMPRESSION: 1. Chronic scarring and fibrosis.  No acute airspace disease. Electronically Signed   By: Randa Ngo M.D.   On: 12/26/2021 21:27   DG Shoulder Left Portable  Result Date: 12/26/2021 CLINICAL DATA:  Un witnessed fall EXAM: LEFT SHOULDER COMPARISON:  None. FINDINGS: Frontal and transscapular views of  the left shoulder are obtained. No acute fracture, subluxation, or dislocation. There is severe left shoulder osteoarthritis, with complete loss of the acromial humeral interval consistent with chronic longstanding rotator cuff tear. Left chest is clear. IMPRESSION: 1. Severe degenerative changes.  No acute fracture. Electronically Signed   By: Randa Ngo M.D.   On: 12/26/2021 21:33   DG Tibia/Fibula Left Port  Result Date: 12/26/2021 CLINICAL DATA:  Un witnessed fall EXAM: PORTABLE LEFT TIBIA AND FIBULA - 2 VIEW COMPARISON:  None. FINDINGS: Frontal and lateral views of the left tibia and fibula are obtained. Portions of the proximal tibia and fibula are excluded by collimation. Bones are osteopenic. No acute displaced fractures. Soft tissues are unremarkable. IMPRESSION: 1. No acute fracture. Electronically Signed   By: Randa Ngo M.D.   On: 12/26/2021 21:35    DG Femur Portable Min 2 Views Left  Result Date: 12/26/2021 CLINICAL DATA:  Un witnessed fall 2 hours ago, pain EXAM: LEFT FEMUR PORTABLE 2 VIEWS COMPARISON:  None. FINDINGS: Frontal and lateral views of the left femur are obtained. No acute fracture. Prominent osteoarthritis of the left hip and knee. Soft tissues are unremarkable. IMPRESSION: 1. No acute fracture. 2. Osteoarthritis left hip and knee. Electronically Signed   By: Randa Ngo M.D.   On: 12/26/2021 21:28    Procedures Procedures   Medications Ordered in ED Medications - No data to display  ED Course/ Medical Decision Making/ A&P                           Patient presents after fall described in HPI above.  Physical exam is largely unremarkable however patient reports tenderness in her left shoulder and left leg.  CT head and CT C-spine ordered given unwitnessed fall in elderly patient with dementia who is unreliable historian.  Plain films of the left shoulder, chest, pelvis, left femur, and left tib-fib ordered.  Basic labs including CBC and BMP ordered.  EKG shows normal sinus rhythm with PVCs, shortened PR, and LVH.  No significant changes compared to prior no evidence of acute ischemia or arrhythmia.  CT head and CT C-spine reviewed by myself are negative for acute intracranial abnormality or traumatic injury or malalignment of the cervical spine.  Plain films of the chest, left shoulder, pelvis, left femur, and left tib-fib reviewed myself and are negative for any acute traumatic injuries.  Based on the patient's unremarkable work-up, I believe she is appropriate for discharge.  No additional labs or imaging are indicated at this time.  Patient will be discharged back to her facility.  Final Clinical Impression(s) / ED Diagnoses Final diagnoses:  Fall  Fall, initial encounter    Rx / DC Orders ED Discharge Orders     None         Erinne Gillentine, Amalia Hailey, MD 12/27/21 1251    Sherwood Gambler, MD 12/29/21 [phone]

## 2021-12-26 NOTE — Discharge Instructions (Addendum)
CT images of your head and neck as well as X-rays of your chest, pelvis, left shoulder, and left leg were all negative for any fractures. Follow up with your PCP if you continue to have pain.

## 2021-12-27 NOTE — ED Notes (Signed)
[facility] called - [facility] here to get patient

## 2021-12-27 NOTE — ED Notes (Signed)
[facility] here to transport pt back to [facility]. 

## 2022-03-08 ENCOUNTER — Non-Acute Institutional Stay: Payer: Medicare Other | Admitting: Student

## 2022-03-08 DIAGNOSIS — R630 Anorexia: Secondary | ICD-10-CM

## 2022-03-08 DIAGNOSIS — R531 Weakness: Secondary | ICD-10-CM

## 2022-03-08 DIAGNOSIS — F0150 Vascular dementia without behavioral disturbance: Secondary | ICD-10-CM

## 2022-03-08 NOTE — Progress Notes (Signed)
? ? ?Manufacturing engineer ?Community Palliative Care Consult Note ?Telephone: [phone]  ?Fax: [phone]  ? ?Date of encounter: 03/08/22 ? ?PATIENT NAME: Sylvia Montoya ?[address] ?[place] [address]   ?[phone] (home)  ?DOB: 1929/04/08 ?MRN: [id number] ?PRIMARY CARE PROVIDER:    ?Vilinda Boehringer, NP  ? ?REFERRING PROVIDER:   ?Vilinda Boehringer, NP  ? ?RESPONSIBLE PARTY:    ?Contact Information   ? ? Name Relation Home Work Mobile  ? Jeannine Kitten Daughter [phone]    ? Matkins,Larry Relative [phone]    ? Faucette,Peggy  [phone]    ? Huey Romans Granddaughter   [phone]  ? ?  ? ? ? ?I met face to face with patient in the facility. Palliative Care was asked to follow this patient by consultation request of  Vilinda Boehringer, NP to address advance care planning and complex medical decision making. This is the initial visit.  ? ?Message left for daughter. ? ?                                 ASSESSMENT AND PLAN / RECOMMENDATIONS:  ? ?Advance Care Planning/Goals of Care: Goals include to maximize quality of life and symptom management. Patient/health care surrogate gave his/her permission to discuss.Our advance care planning conversation included a discussion about:    ?The value and importance of advance care planning  ?Experiences with loved ones who have been seriously ill or have died  ?Exploration of personal, cultural or spiritual beliefs that might influence medical decisions  ?Exploration of goals of care in the event of a sudden injury or illness  ?CODE STATUS: DNR ? ?Education provided on palliative medicine. Will review goals of care with family. Palliative medicine will continue to provide supportive care and symptom management.  ? ?Symptom Management/Plan: ? ?Vascular dementia-staff to continue assisting with all adl's. Reorient and redirect as needed. Monitor for falls safety. Monitor for worsening cognition, functional status. Continue donepezil as  directed. ? ?Generalized weakness-patient encouraged to be out of bed as tolerated. Staff to continue assisting with adl's.  ? ?Appetite-fair appetite reported. Continue nutritional supplement BID, encourage foods patient enjoys. Routine weights per facility. Staff to assist with feeding as needed.  ? ?Follow up Palliative Care Visit: Palliative care will continue to follow for complex medical decision making, advance care planning, and clarification of goals. Return in 4 weeks or prn. ? ?This visit was coded based on medical decision making (MDM). ? ?PPS: 30% ? ?HOSPICE ELIGIBILITY/DIAGNOSIS: TBD ? ?Chief Complaint: Palliative Medicine initial visit.  ? ?HISTORY OF PRESENT ILLNESS:  Sylvia Montoya is a 88 y.o. year old female  with Vascular dementia, CVA with hemiparesis,chronic kidney disease, GERD, major depressive disorder, atrial fibrillation, cardiomegaly, leg contractures, hypertension, hyperlipidemia, spinal stenosis, anemia, hx of PE.  ?  ?Patient resides at [facility]. She denies any pain, shortness of breath, nausea at this time. she is dependent for all ADL's; She is out of bed as tolerated. She endorses sleeping well. Fair appetite per facility staff. HPI & ROS obtained from both patient and staff. ? ? ?History obtained from review of EMR, discussion with primary team, and interview with family, facility staff/caregiver and/or Ms. Wemhoff.  ?I reviewed available labs, medications, imaging, studies and related documents from the EMR.  Records reviewed and summarized above.  ? ? ?Physical Exam: ?Weight: 118. 4 pounds ?Constitutional: NAD ?General: frail appearing, thin ?EYES: anicteric  sclera, lids intact, no discharge  ?ENMT: intact hearing, oral mucous membranes moist, dentition intact ?CV: S1S2, RRR, no LE edema ?Pulmonary: LCTA, no increased work of breathing, no cough, room air ?Abdomen: normo-active BS + 4 quadrants, soft and non tender, no ascites ?GU: deferred ?MSK:  non-ambulatory ?Skin: warm and dry, no rashes or wounds on visible skin ?Neuro:  + generalized weakness,   ?Psych: non-anxious affect, A and O x 2, forgetful ?Hem/lymph/immuno: no widespread bruising ?CURRENT PROBLEM LIST:  ?Patient Active Problem List  ? Diagnosis Date Noted  ? Chronic retention of urine 02/06/2019  ? Essential hypertension 12/09/2018  ? Persistent atrial fibrillation ([place]) 12/09/2018  ? Chronic deep vein thrombosis (DVT) of right lower extremity ([place]) 12/09/2018  ? Pulmonary emboli ([place]) 12/09/2018  ? Dyslipidemia 12/09/2018  ? GERD without esophagitis 12/09/2018  ? Vascular dementia without behavioral disturbance ([place]) 12/09/2018  ? Chronic blood loss anemia 12/09/2018  ? Physical deconditioning 12/09/2018  ? ?PAST MEDICAL HISTORY:  ?Active Ambulatory Problems  ?  Diagnosis Date Noted  ? Essential hypertension 12/09/2018  ? Persistent atrial fibrillation ([place]) 12/09/2018  ? Chronic deep vein thrombosis (DVT) of right lower extremity ([place]) 12/09/2018  ? Pulmonary emboli ([place]) 12/09/2018  ? Dyslipidemia 12/09/2018  ? GERD without esophagitis 12/09/2018  ? Vascular dementia without behavioral disturbance ([place]) 12/09/2018  ? Chronic blood loss anemia 12/09/2018  ? Physical deconditioning 12/09/2018  ? Chronic retention of urine 02/06/2019  ? ?Resolved Ambulatory Problems  ?  Diagnosis Date Noted  ? Acute retention of urine 12/09/2018  ? ?Past Medical History:  ?Diagnosis Date  ? Anemia   ? Atrial fibrillation ([place])   ? Dementia ([place])   ? DVT (deep venous thrombosis) ([place])   ? Falls frequently   ? GERD (gastroesophageal reflux disease)   ? Heme positive stool   ? Hyperlipidemia   ? Hypertension   ? Lumbar spinal stenosis   ? Stroke [facility])   ? Traumatic hematoma of right thoracic region   ? ?SOCIAL HX:  ?Social History  ? ?Tobacco Use  ? Smoking status: Former  ?  Types: Cigarettes  ?  Quit date: 58  ?  Years since quitting: 35.2  ? Smokeless tobacco: Never  ?Substance Use Topics  ? Alcohol use: Never   ? ?FAMILY HX: No family history on file.   ? ?ALLERGIES:  ?Allergies  ?Allergen Reactions  ? Trazodone And Nefazodone Other (See Comments) and Hives  ?  Per MAR  ?   ?PERTINENT MEDICATIONS:  ?Outpatient Encounter Medications as of 03/08/2022  ?Medication Sig  ? acetaminophen (TYLENOL) 500 MG tablet Take 1,000 mg by mouth 2 (two) times daily.  ? amLODipine (NORVASC) 5 MG tablet Take 5 mg by mouth every morning.  ? aspirin 325 MG tablet Take 325 mg by mouth every morning.  ? atorvastatin (LIPITOR) 10 MG tablet Take 10 mg by mouth at bedtime.  ? cholecalciferol (VITAMIN D3) 25 MCG (1000 UNIT) tablet Take 1,000 Units by mouth every morning.  ? citalopram (CELEXA) 40 MG tablet Take 40 mg by mouth every morning.  ? cloNIDine (CATAPRES) 0.2 MG tablet Take 0.2 mg by mouth 2 (two) times daily.  ? donepezil (ARICEPT) 10 MG tablet Take 10 mg by mouth at bedtime.   ? Ensure (ENSURE) Take 90 mLs by mouth 2 (two) times daily between meals.  ? famotidine (PEPCID) 20 MG tablet Take 20 mg by mouth at bedtime.  ? hydrOXYzine (ATARAX) 10 MG tablet Take 10 mg by mouth at  bedtime.  ? losartan (COZAAR) 50 MG tablet Take 50 mg by mouth every morning.  ? metoprolol tartrate (LOPRESSOR) 25 MG tablet Take 75 mg by mouth 2 (two) times daily.  ? ?No facility-administered encounter medications on file as of 03/08/2022.  ? ?Thank you for the opportunity to participate in the care of Ms. Hailes.  The palliative care team will continue to follow. Please call our office at [phone] if we can be of additional assistance.  ? ?Ezekiel Slocumb, NP  ? ?COVID-19 PATIENT SCREENING TOOL ?Asked and negative response unless otherwise noted: ? ?Have you had symptoms of covid, tested positive or been in contact with someone with symptoms/positive test in the past 5-10 days? No ? ?

## 2022-03-09 ENCOUNTER — Other Ambulatory Visit: Payer: Self-pay

## 2022-03-23 ENCOUNTER — Non-Acute Institutional Stay: Payer: Medicare Other | Admitting: Student

## 2022-03-23 DIAGNOSIS — F0150 Vascular dementia without behavioral disturbance: Secondary | ICD-10-CM

## 2022-03-23 DIAGNOSIS — R630 Anorexia: Secondary | ICD-10-CM

## 2022-03-23 DIAGNOSIS — Z515 Encounter for palliative care: Secondary | ICD-10-CM

## 2022-03-23 NOTE — Progress Notes (Signed)
? ? ?Manufacturing engineer ?Community Palliative Care Consult Note ?Telephone: [phone]  ?Fax: [phone]  ? ? ?Date of encounter: 03/23/22 ?9:40 PM ?PATIENT NAME: Sylvia Montoya ?[address] ?[place] [address]   ?[phone] (home)  ?DOB: 11-28-1929 ?MRN: [id number] ?PRIMARY CARE PROVIDER:    ?Vilinda Boehringer, NP ? ?REFERRING PROVIDER:   ?Vilinda Boehringer, NP ? ?RESPONSIBLE PARTY:    ?Contact Information   ? ? Name Relation Home Work Mobile  ? Sylvia Montoya Daughter [phone]    ? Sylvia Montoya,Sylvia Montoya Relative [phone]    ? Sylvia Montoya,Sylvia Montoya  [phone]    ? Sylvia Montoya Granddaughter   [phone]  ? ?  ? ? ? ?I met face to face with patient in the facility. Palliative Care was asked to follow this patient by consultation request of  Vilinda Boehringer, NP to address advance care planning and complex medical decision making. This is a follow up visit. ? ?Message left for daughter; awaiting return call.  ? ?                                 ASSESSMENT AND PLAN / RECOMMENDATIONS:  ? ?Advance Care Planning/Goals of Care: Goals include to maximize quality of life and symptom management. Patient/health care surrogate gave his/her permission to discuss. ?Our advance care planning conversation included a discussion about:    ?The value and importance of advance care planning  ?Experiences with loved ones who have been seriously ill or have died  ?Exploration of personal, cultural or spiritual beliefs that might influence medical decisions  ?Exploration of goals of care in the event of a sudden injury or illness  ?CODE STATUS: DNR ? ?Education provided on palliative medicine vs. Hospice services. Will discuss recent declines with family. Palliative will continue to provide supportive care.  ? ?Symptom Management/Plan: ? ?Vascular dementia-patient sleeping more, not getting out of bed as much, increased weakness, further decline in appetite. Staff to continue providing supportive care. Reorient and  redirect as needed. Continue donepezil as directed. Will discuss with family recent declines. ? ?Appetite-patient with 4 pound loss in past month. Staff to assist with feeding, continue nutritional supplements BID, offer foods patient enjoys.  ? ?Follow up Palliative Care Visit: Palliative care will continue to follow for complex medical decision making, advance care planning, and clarification of goals. Return in 4 weeks or prn. ? ? ?This visit was coded based on medical decision making (MDM). ? ?PPS: 30%, weak ? ?HOSPICE ELIGIBILITY/DIAGNOSIS: TBD ? ?Chief Complaint: Palliative Medicine follow up visit.  ? ?HISTORY OF PRESENT ILLNESS:  Sylvia Montoya is a 88 y.o. year old female  with Vascular dementia, CVA with hemiparesis, chronic kidney disease, GERD, major depressive disorder, atrial fibrillation, cardiomegaly, leg contractures, hypertension, hyperlipidemia, spinal stenosis, anemia, hx of PE.   ? ?Patient resides at [facility]. Nursing staff report patient with decline in appetite, she is refusing medications at times. Staff report she will occasionally yell out. She is sleeping more and rarely getting out of bed. HPI primarily obtained from staff due to patient's dementia. ? ?Weights: 3/23 114 pounds ?2/23 118.4 pounds ?1/23 118.4 pounds ?12/22 120 pounds ?11/22 123 pounds ? ?History obtained from review of EMR, discussion with primary team, and interview with family, facility staff/caregiver and/or [name].  ?I reviewed available labs, medications, imaging, studies and related documents from the EMR.  Records reviewed and summarized above.  ? ?Physical Exam: ?  Pulse 64, resp 16, b/p 112/60, sats 96% on room air.  ?Constitutional: NAD ?General: frail appearing, thin ?EYES: anicteric sclera, lids intact, no discharge  ?ENMT: intact hearing, oral mucous membranes moist, dentition intact ?CV: S1S2, RRR, no LE edema ?Pulmonary: LCTA, no increased work of breathing, no cough, room air ?Abdomen:  normo-active BS + 4 quadrants, soft and non tender, no ascites ?GU: deferred ?MSK: +sarcopenia, non-ambulatory, right sided weakness ?Skin: warm and dry, no rashes or wounds on visible skin ?Neuro:  +generalized weakness, A & O x 2, forgetful ?Psych: non-anxious affect ?Hem/lymph/immuno: no widespread bruising ? ? ?Thank you for the opportunity to participate in the care of [name].  The palliative care team will continue to follow. Please call our office at [phone] if we can be of additional assistance.  ? ?Ezekiel Slocumb, NP  ? ?COVID-19 PATIENT SCREENING TOOL ?Asked and negative response unless otherwise noted:  ? ?Have you had symptoms of covid, tested positive or been in contact with someone with symptoms/positive test in the past 5-10 days? No ? ?

## 2022-03-24 ENCOUNTER — Telehealth: Payer: Self-pay | Admitting: Student

## 2022-03-24 NOTE — Telephone Encounter (Signed)
Palliative NP spoke with patient's daughter Remo Lipps. We discussed recent changes and declines. She expresses comfort for patient. She will be in town next week, plan to meet on 03/28/22 at 3pm. Introduced hospice due to patient's recent declines. Will continue discussion next week and likely refer to hospice.  ?

## 2022-03-28 ENCOUNTER — Non-Acute Institutional Stay: Payer: Medicare Other | Admitting: Student

## 2022-03-28 DIAGNOSIS — Z515 Encounter for palliative care: Secondary | ICD-10-CM

## 2022-03-28 DIAGNOSIS — R630 Anorexia: Secondary | ICD-10-CM

## 2022-03-28 DIAGNOSIS — F0150 Vascular dementia without behavioral disturbance: Secondary | ICD-10-CM

## 2022-03-28 NOTE — Progress Notes (Signed)
? ? ?Manufacturing engineer ?Community Palliative Care Consult Note ?Telephone: [phone]  ?Fax: [phone]  ? ? ?Date of encounter: 03/28/22 ?3:41 PM ?PATIENT NAME: Sylvia Montoya ?[address] ?[place] [address]   ?[phone] (home)  ?DOB: 05-29-1929 ?MRN: [id number] ?PRIMARY CARE PROVIDER:    ?Vilinda Boehringer, NP ? ?REFERRING PROVIDER:   ?Vilinda Boehringer, NP ? ?RESPONSIBLE PARTY:    ?Contact Information   ? ? Name Relation Home Work Mobile  ? Jeannine Kitten Daughter [phone]    ? Matkins,Larry Relative [phone]    ? Faucette,Peggy  [phone]    ? Huey Romans Granddaughter   [phone]  ? ?  ? ? ? ?I met face to face with patient and family in the facility. Palliative Care was asked to follow this patient by consultation request of  Vilinda Boehringer, NP to address advance care planning and complex medical decision making. This is a follow up visit. ? ?                                 ASSESSMENT AND PLAN / RECOMMENDATIONS:  ? ?Advance Care Planning/Goals of Care: Goals include to maximize quality of life and symptom management. Patient/health care surrogate gave his/her permission to discuss. ?Our advance care planning conversation included a discussion about:    ?The value and importance of advance care planning  ?Experiences with loved ones who have been seriously ill or have died  ?Exploration of personal, cultural or spiritual beliefs that might influence medical decisions  ?Exploration of goals of care in the event of a sudden injury or illness  ?CODE STATUS: DNR ? ?Patient with continued decline. Education on palliative medicine vs. Hospice services. Will refer for hospice evaluation. Family is in agreement with [facility]. Answered questions regarding hospice that family may have.  ? ?Symptom Management/Plan: ? ?Vascular dementia-patient sleeping greater than 20 hours a day. She is rarely out of bed. Her appetite continues to decline. She has worsening knee/leg  contractures. Refusing medications and now meals at times. Will refer for hospice evaluation. ? ?Appetite-patient appetite varies; she is eating 0 to 25% of meals, occasionally 50%, continues on nutritional supplement BID. Staff to continue assisting with meals, monitor for aspiration. Staff is asked to obtain weight. Encourage foods patient enjoys. ? ?Follow up Palliative Care Visit: Palliative care will continue to follow for complex medical decision making, advance care planning, and clarification of goals. Return PRN.  ? ? ?This visit was coded based on medical decision making (MDM). ? ?PPS: 30%, weak ? ?HOSPICE ELIGIBILITY/DIAGNOSIS: TBD ? ?Chief Complaint: Palliative Medicine follow up visit.  ? ?HISTORY OF PRESENT ILLNESS:  Sylvia Montoya is a 88 y.o. year old female  with Vascular dementia, CVA with hemiparesis, chronic kidney disease, GERD, major depressive disorder, atrial fibrillation, cardiomegaly, leg contractures, hypertension, hyperlipidemia, spinal stenosis, anemia, hx of PE.    ? ?Patient resides at [facility]. Patient with increased weakness. She is now rarely getting out of bed. She is refusing her medications and now meals at times. She is eating 0-25%, occasionally 50% of some meals. She had a 4 pound loss last month, no weight obtained so far this month. She is visibly thinner, clothes are loose, ill fitting. She is sleeping more, > 20 hours a day. Knee contracture is worsening. She has thinning skin to coccyx, area is not open at this time, but has Allevyn dressing for  protection. She is more withdrawn; she is no longer engaging in conversation. Now only speaking 1-2 words. She is no longer yelling out as she was before. HPI and ROS obtained from family and staff due to her advanced dementia.  ? ?Weights: 3/23 114 pounds ?2/23 118.4 pounds ?1/23 118.4 pounds ?12/22 120 pounds ?11/22 123 pounds ? ?History obtained from review of EMR, discussion with primary team, and interview  with family, facility staff/caregiver and/or Ms. Segoviano.  ?I reviewed available labs, medications, imaging, studies and related documents from the EMR.  Records reviewed and summarized above.  ? ?ROS ? ?Unable to obtain due to patient's dementia.  ? ?Physical Exam: ? ?Pulse 76, resp 16, sats 95% on room air ?Constitutional: NAD ?General: frail appearing, thin ?EYES: anicteric sclera, lids intact, no discharge  ?ENMT: intact hearing, oral mucous membranes dry  ?Pulmonary: LCTA, no increased work of breathing, no cough, room air ?Abdomen: normo-active BS + 4 quadrants, soft and non tender, no ascites ?GU: deferred ?MSK: +sarcopenia, non-ambulatory, knee contracture ?Skin: warm and dry, no rashes or wounds on visible skin ?Neuro: +generalized weakness ?Psych: non-anxious affect, A and O x 1 ?Hem/lymph/immuno: no widespread bruising ? ? ?Thank you for the opportunity to participate in the care of Ms. Slagter.  The palliative care team will continue to follow. Please call our office at [phone] if we can be of additional assistance.  ? ?Ezekiel Slocumb, NP  ? ?COVID-19 PATIENT SCREENING TOOL ?Asked and negative response unless otherwise noted:  ? ?Have you had symptoms of covid, tested positive or been in contact with someone with symptoms/positive test in the past 5-10 days? No ? ?

## 2022-10-25 DEATH — deceased

## 2023-03-15 IMAGING — DX DG SHOULDER 1V*L*
1 series · 2 of 2 positions shown · non-contrast
Comparison: None.

CLINICAL DATA: Un witnessed fall

EXAM:
LEFT SHOULDER

[Series 1: shoulder · 0.14mm/px · 2 of 2 slices shown]
[im 1/2]
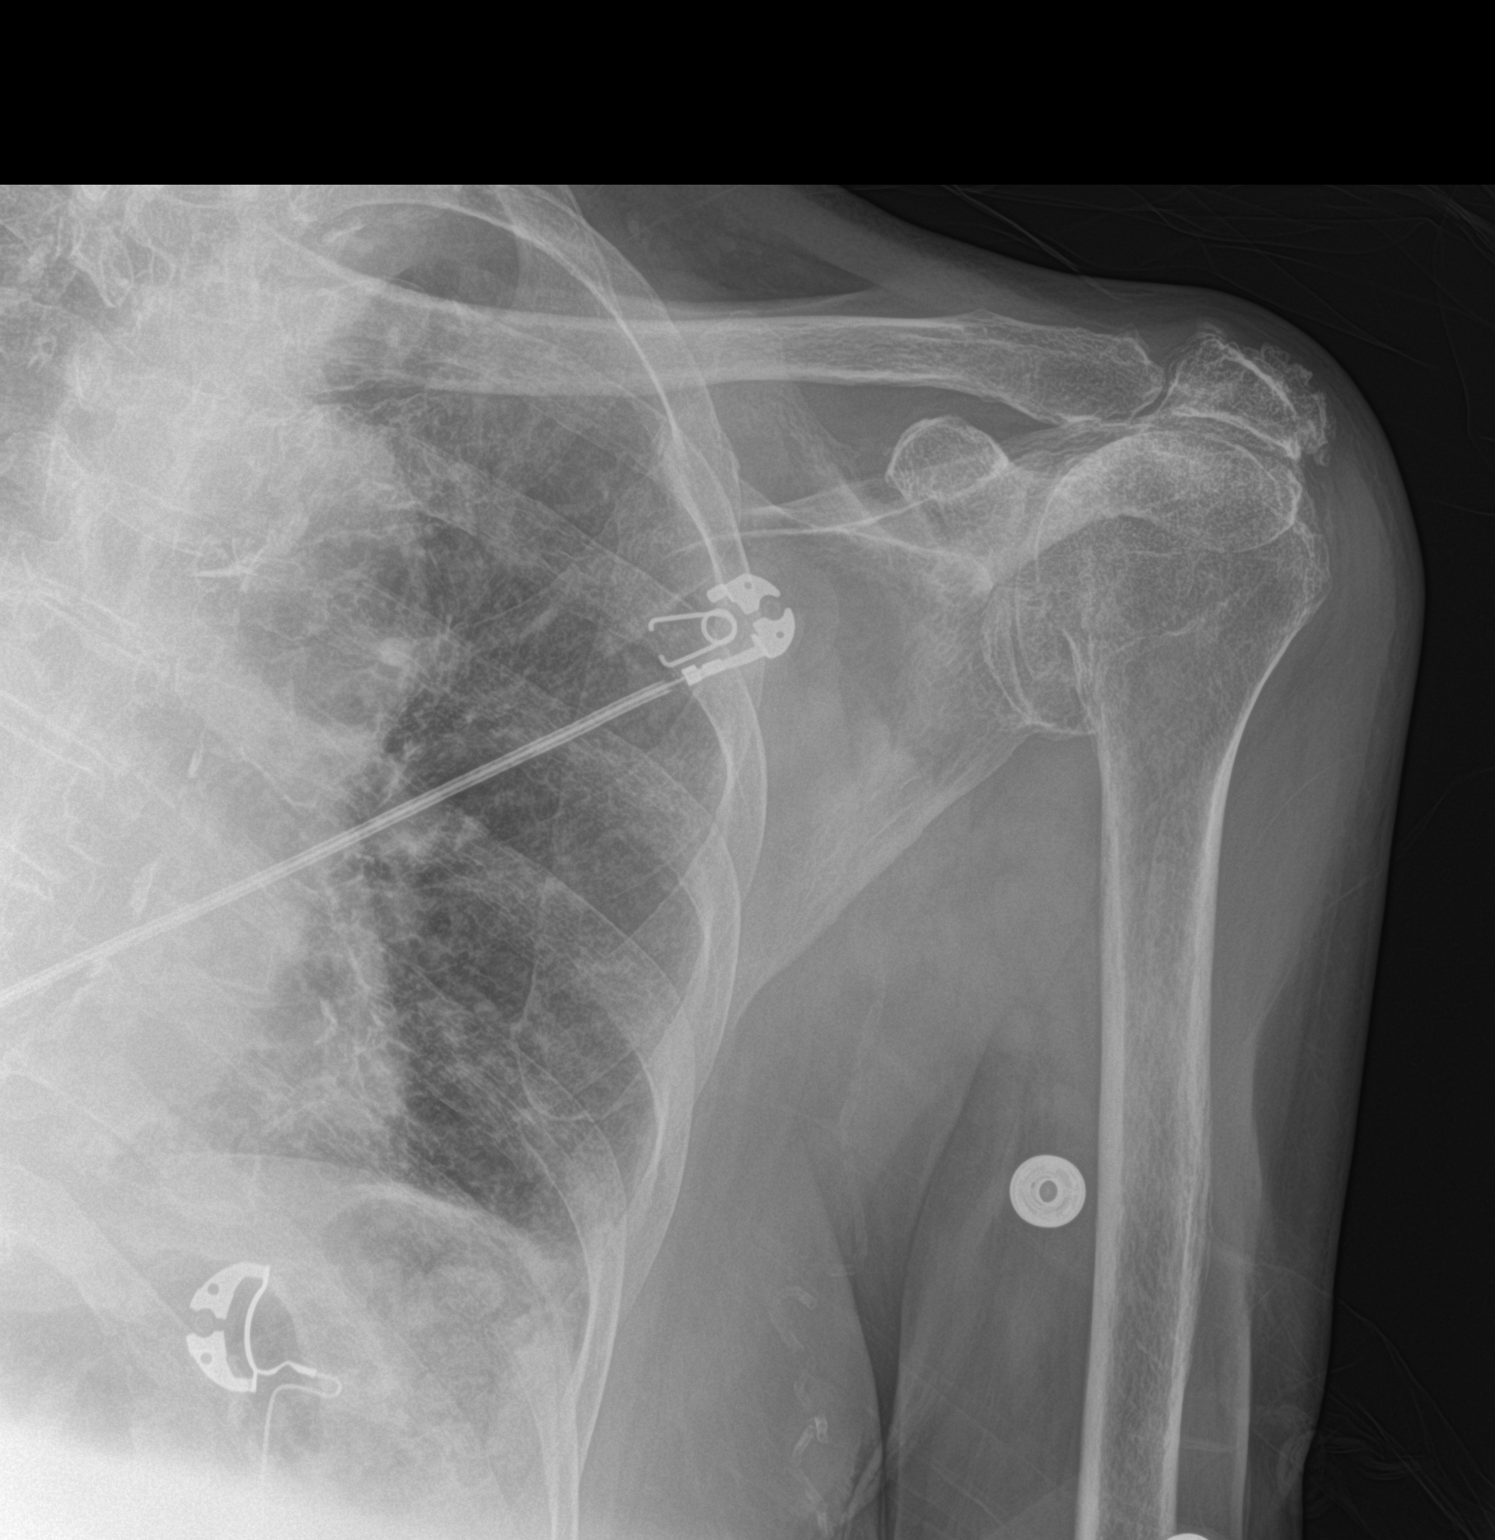
[im 2/2]
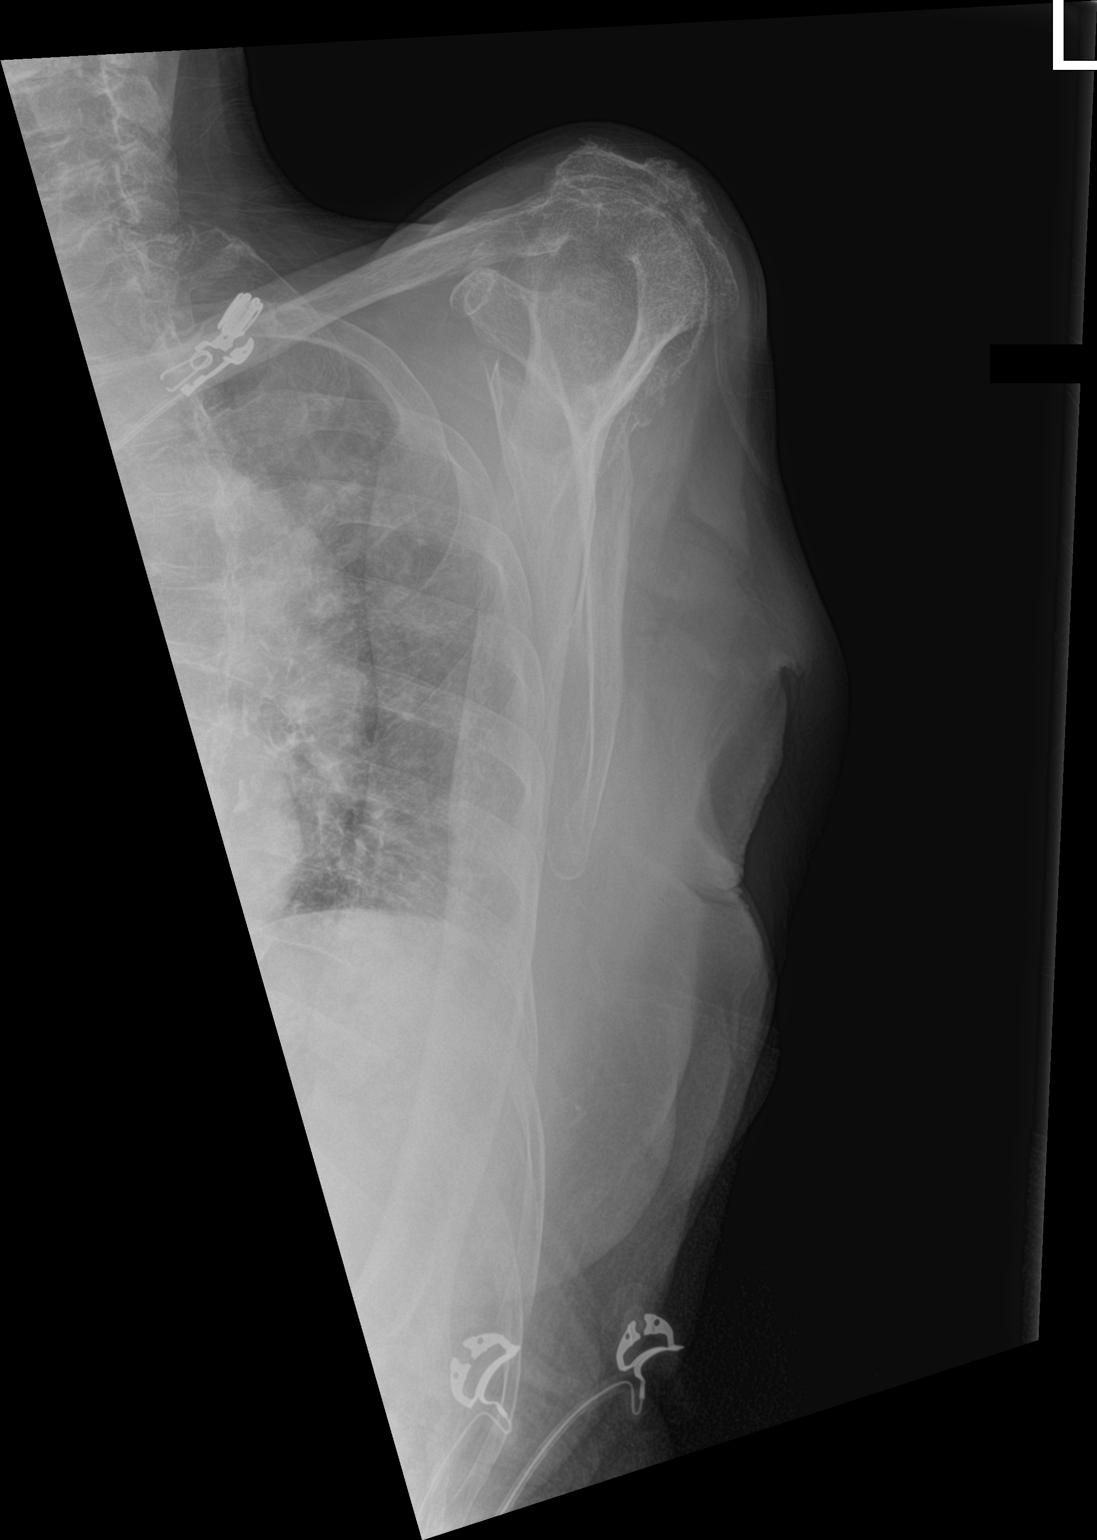

[2 of 2 positions shown; findings below may reference images not displayed]

FINDINGS: Frontal and transscapular views of the left shoulder are obtained.
No acute fracture, subluxation, or dislocation. There is severe left
shoulder osteoarthritis, with complete loss of the acromial humeral
interval consistent with chronic longstanding rotator cuff tear.
Left chest is clear.
IMPRESSION: 1. Severe degenerative changes.  No acute fracture.

## 2023-03-15 IMAGING — CT CT HEAD W/O CM
3 of 4 series · 13 of 47 positions shown, 15 images · non-contrast
Comparison: CT head and C-spine 08/17/2021

CLINICAL DATA: Head trauma, minor (Age >= 65y); Neck trauma (Age >=
65y). Unwitnessed fall

EXAM:
CT HEAD WITHOUT CONTRAST
CT CERVICAL SPINE WITHOUT CONTRAST
TECHNIQUE: Multidetector CT imaging of the head and cervical spine was
performed following the standard protocol without intravenous
contrast. Multiplanar CT image reconstructions of the cervical spine
were also generated.

[Series 3: head without · axial · non-contrast · 0.48mm/px · z∈[-117,+8]mm · 7 of 35 slices shown, 9 images]
[im 5/35  brain]
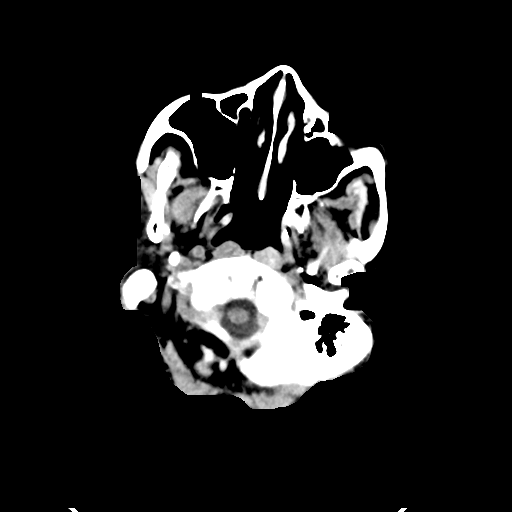
[im 5/35  bone]
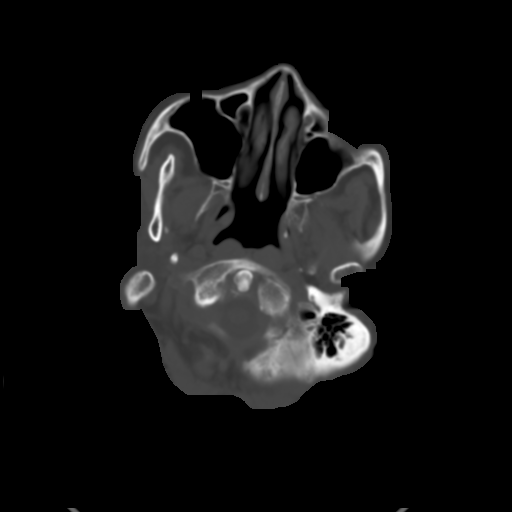
[im 9/35  brain]
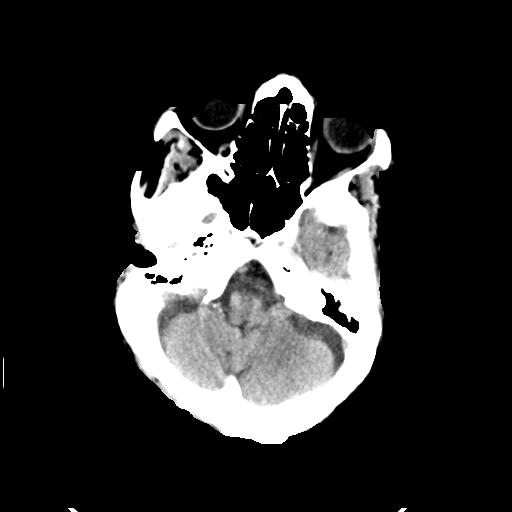
[im 13/35  brain]
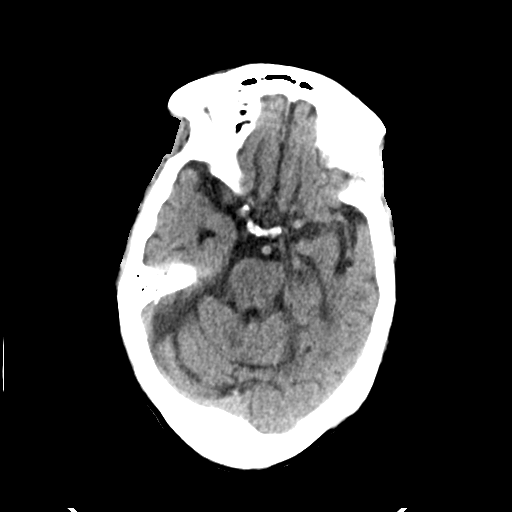
[im 18/35  brain]
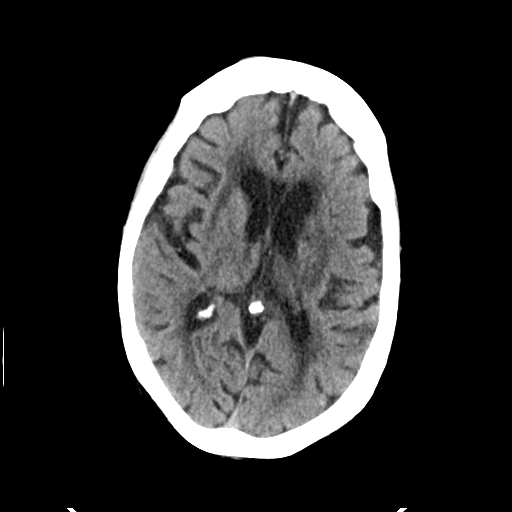
[im 22/35  brain]
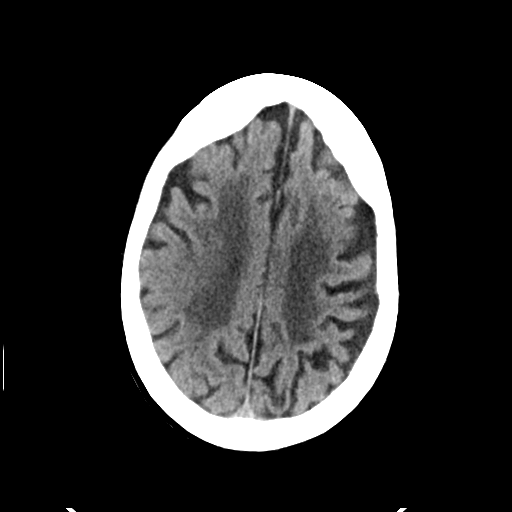
[im 22/35  bone]
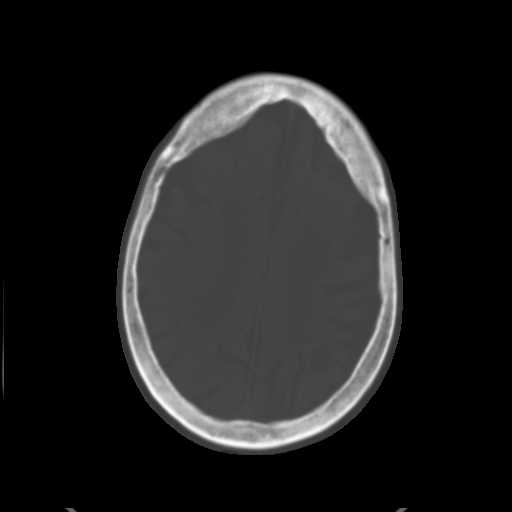
[im 26/35  brain]
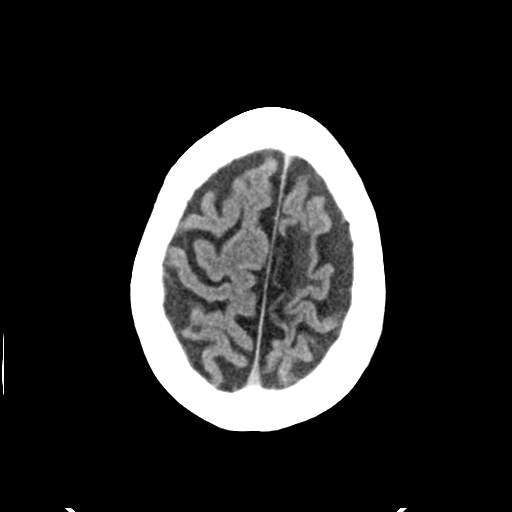
[im 30/35  brain]
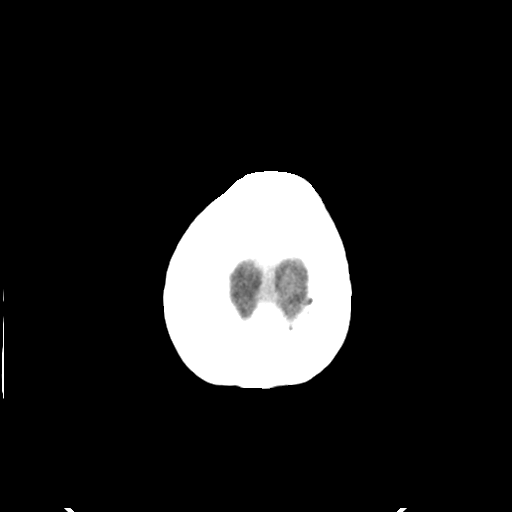

[Series 5: head without cor · coronal · non-contrast · 0.34mm/px · 3 of 73 slices shown]
[im 25/73  brain]
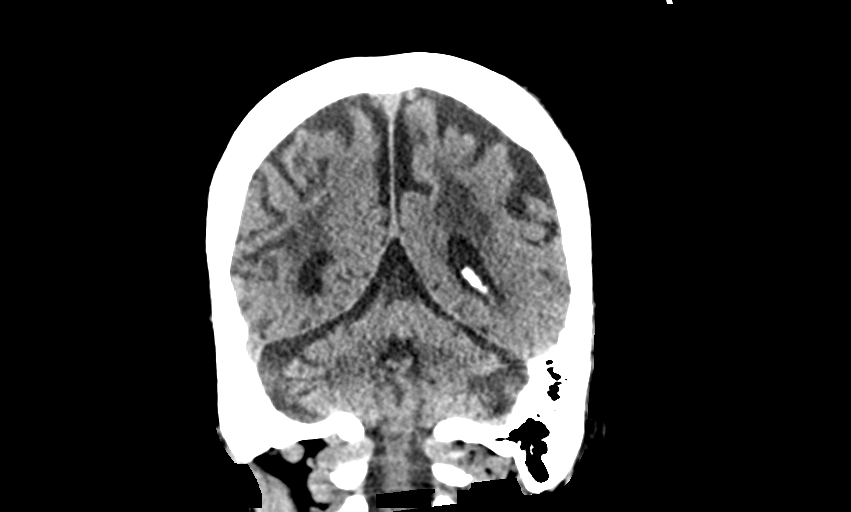
[im 33/73  brain]
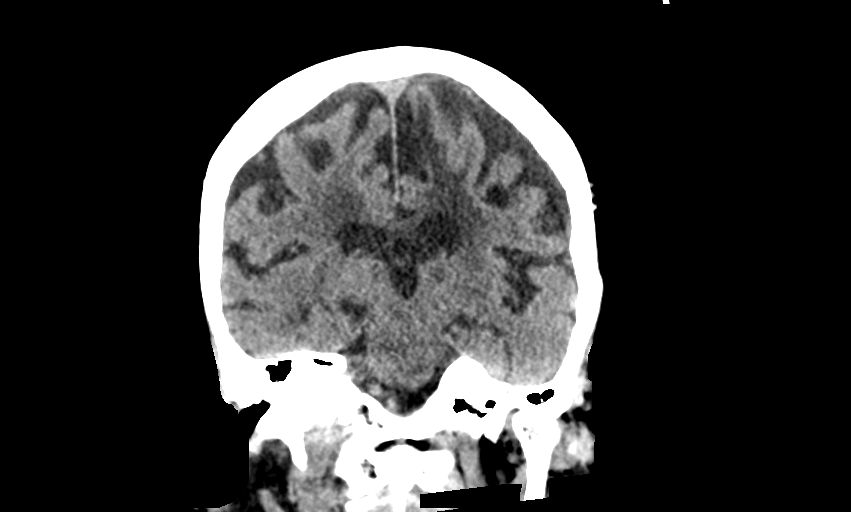
[im 41/73  brain]
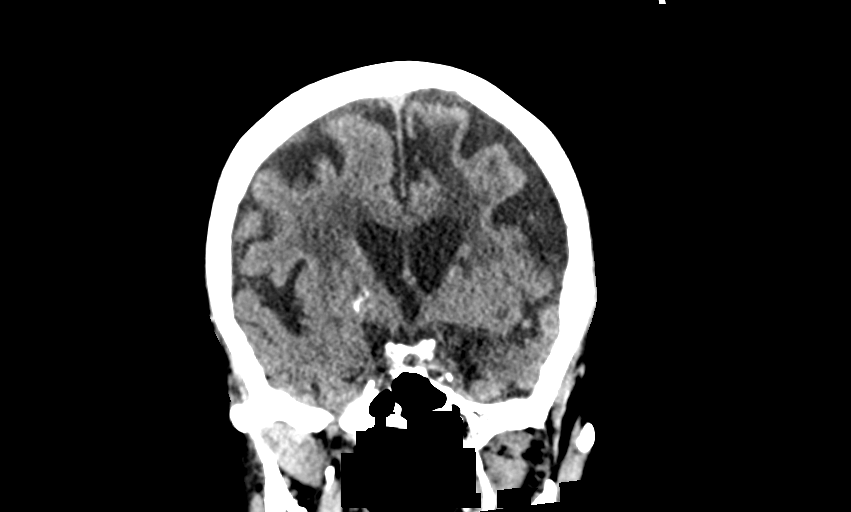

[Series 6: head without sag · sagittal · non-contrast · 0.34mm/px · 3 of 67 slices shown]
[im 23/67  brain]
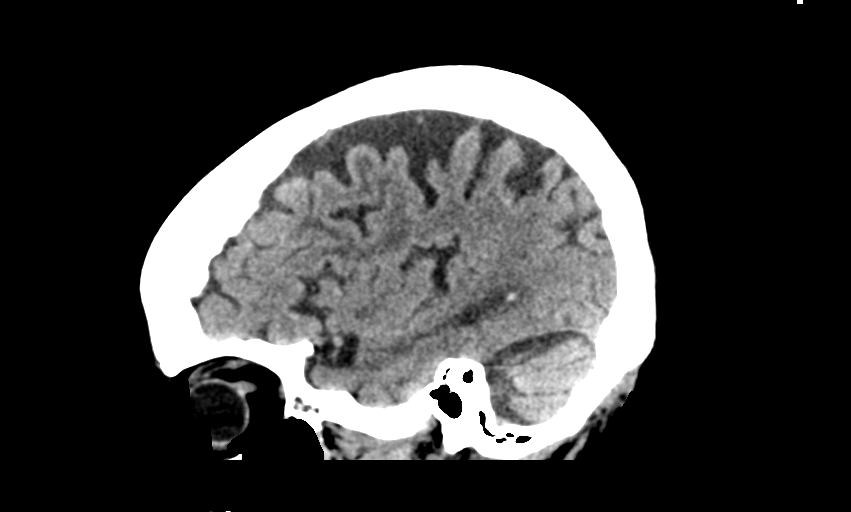
[im 34/67  brain]
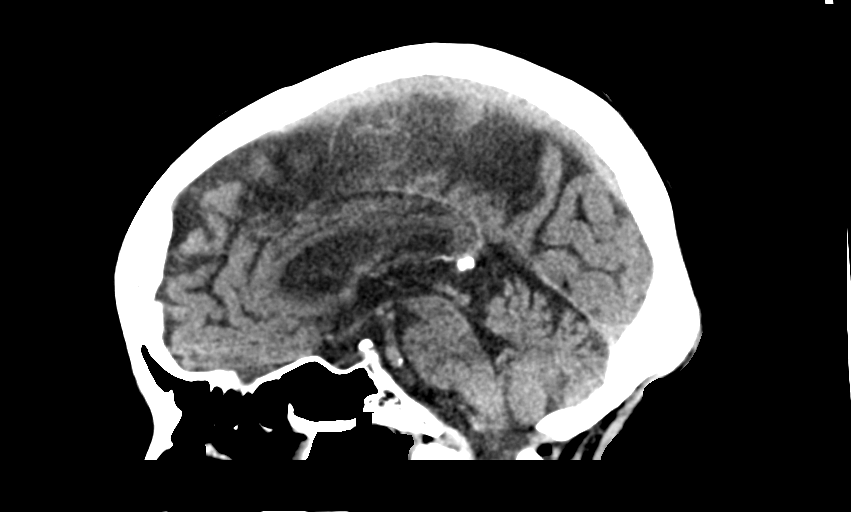
[im 45/67  brain]
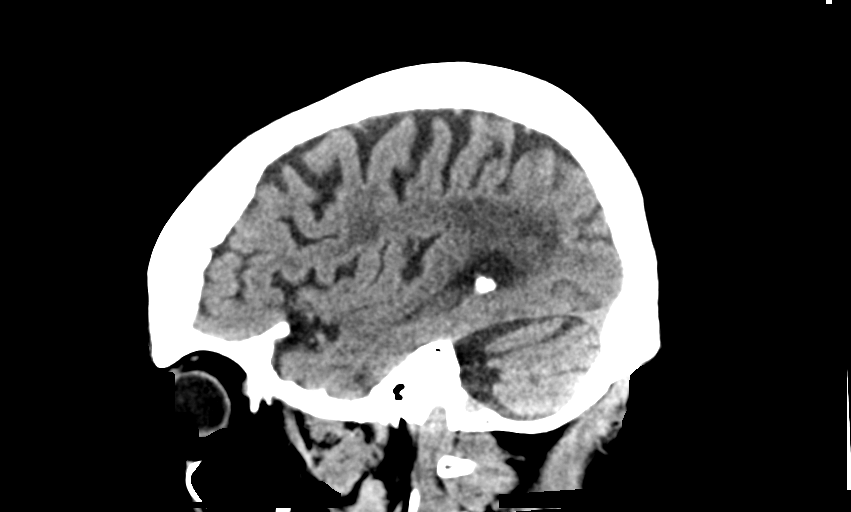

[13 of 47 positions shown; findings below may reference images not displayed]

FINDINGS: CT HEAD FINDINGS

BRAIN:
BRAIN
Cerebral ventricle sizes are concordant with the degree of cerebral
volume loss. Patchy and confluent areas of decreased attenuation are
noted throughout the deep and periventricular white matter of the
cerebral hemispheres bilaterally, compatible with chronic
microvascular ischemic disease. Similar-appearing encephalomalacia
of the left frontal convexity.

No evidence of large-territorial acute infarction. No parenchymal
hemorrhage. No mass lesion. No extra-axial collection.

No mass effect or midline shift. No hydrocephalus. Basilar cisterns
are patent.

Vascular: No hyperdense vessel. Atherosclerotic calcifications are
present within the cavernous internal carotid arteries.

Skull: No acute fracture or focal lesion.

Sinuses/Orbits: Paranasal sinuses and mastoid air cells are clear.
Bilateral lens replacement. Otherwise the orbits are unremarkable.

Other: None.

CT CERVICAL SPINE FINDINGS

Alignment: Grade 1 anterolisthesis of C2 on C3. Straightening of
normal cervical lordosis likely due to positioning and degenerative
changes.

Skull base and vertebrae: Multilevel severe degenerative changes of
the spine. No acute fracture. No aggressive appearing focal osseous
lesion or focal pathologic process.

Soft tissues and spinal canal: No prevertebral fluid or swelling. No
visible canal hematoma.

Upper chest: Biapical pleural/pulmonary scarring.

Other: Atherosclerotic plaque.
IMPRESSION: 1. No acute intracranial abnormality.
2. No acute displaced fracture or traumatic listhesis of the
cervical spine.

## 2023-03-15 IMAGING — DX DG CHEST 1V PORT
1 series · 1 of 1 positions shown · non-contrast
Comparison: 08/17/2021

CLINICAL DATA: Un witnessed fall 2 hours ago, left shoulder pain

EXAM:
PORTABLE CHEST 1 VIEW

[chest]
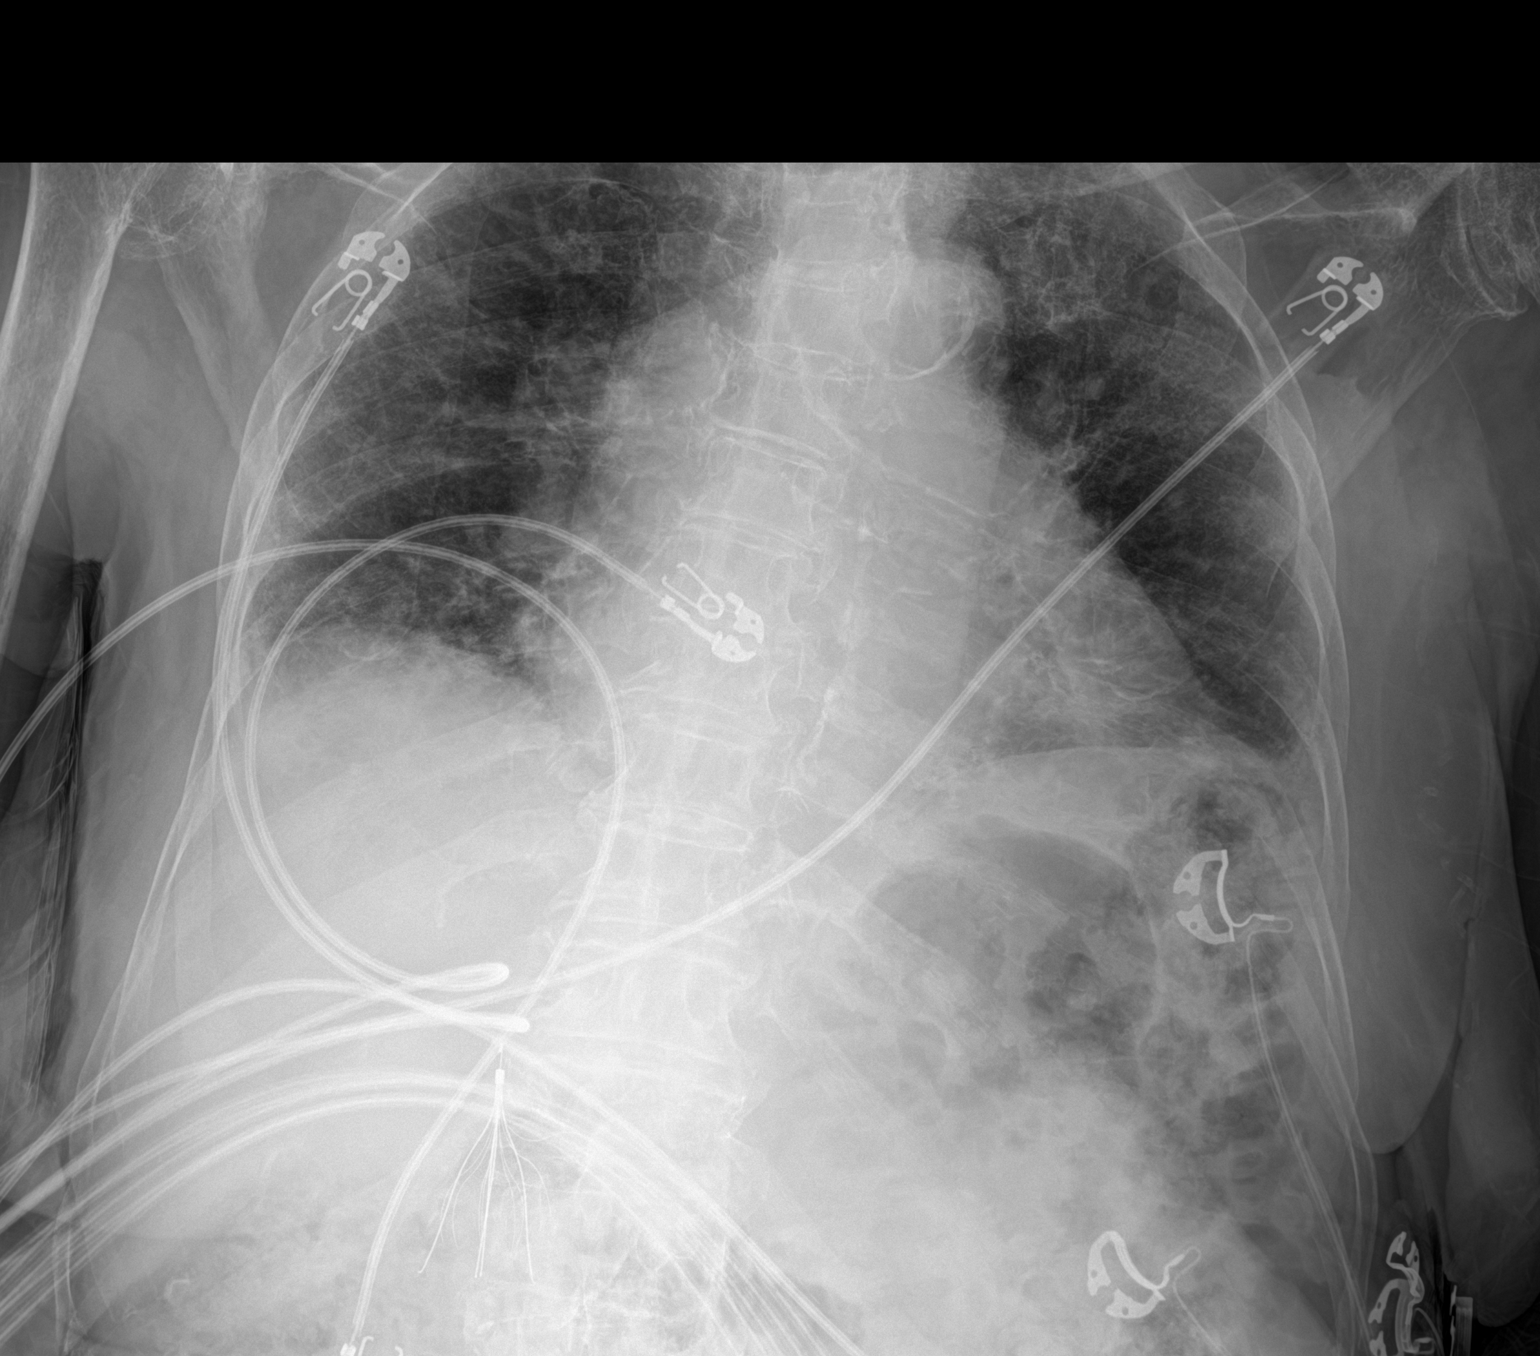

[1 of 1 positions shown; findings below may reference images not displayed]

FINDINGS: Single frontal view of the chest demonstrates a stable cardiac
silhouette. Chronic scarring and fibrosis throughout the lungs
without focal consolidation, effusion, or pneumothorax. No acute
displaced fracture. IVC filter unchanged.
IMPRESSION: 1. Chronic scarring and fibrosis.  No acute airspace disease.

## 2023-03-15 IMAGING — DX DG PORTABLE PELVIS
1 series · 1 of 1 positions shown · non-contrast
Comparison: 08/17/2021

CLINICAL DATA: Un witnessed fall 2 hours ago

EXAM:
PORTABLE PELVIS 1-2 VIEWS

[pelvis]
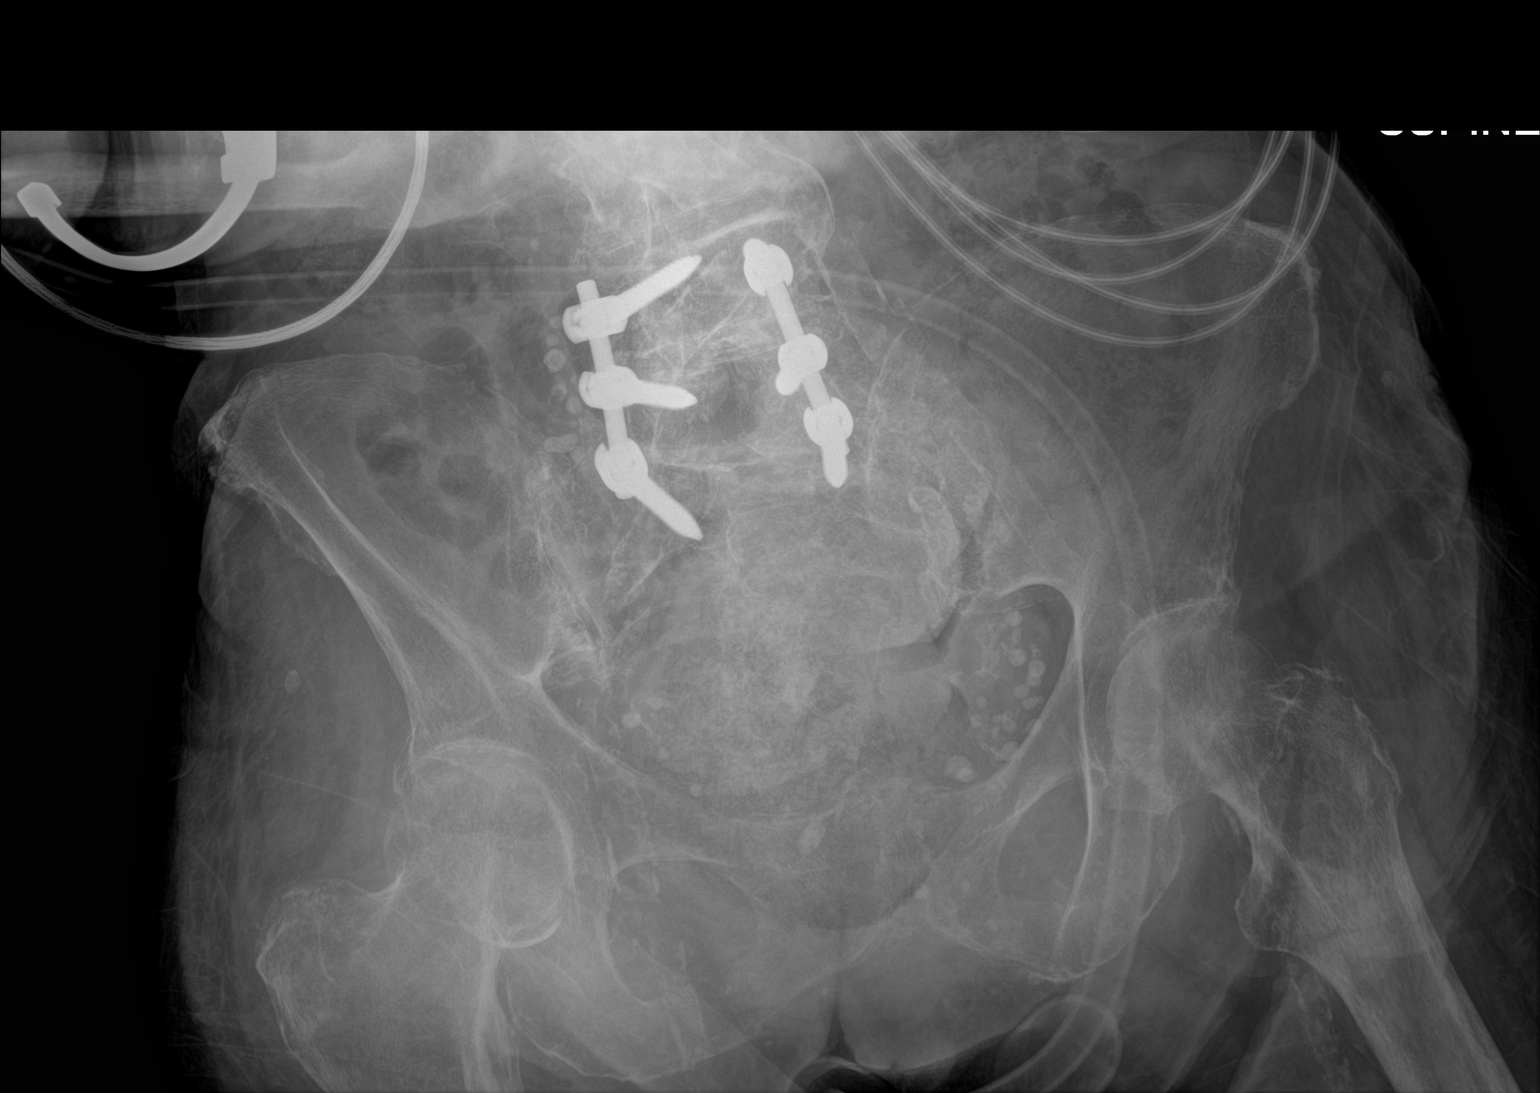

[1 of 1 positions shown; findings below may reference images not displayed]

FINDINGS: Single frontal view of the pelvis includes both hips. Bones are
osteopenic. No acute displaced fracture. Symmetrical hip
osteoarthritis. Stable postsurgical changes lumbosacral junction.
IMPRESSION: 1. Severe osteopenia limiting evaluation.
2. No displaced fracture.
3. Stable osteoarthritis.

## 2023-03-15 IMAGING — DX DG TIBIA/FIBULA PORT 2V*L*
1 series · 2 of 2 positions shown · non-contrast
Comparison: None.

CLINICAL DATA: Un witnessed fall

EXAM:
PORTABLE LEFT TIBIA AND FIBULA - 2 VIEW

[Series 1: leg · 0.14mm/px · 2 of 2 slices shown]
[im 1/2]
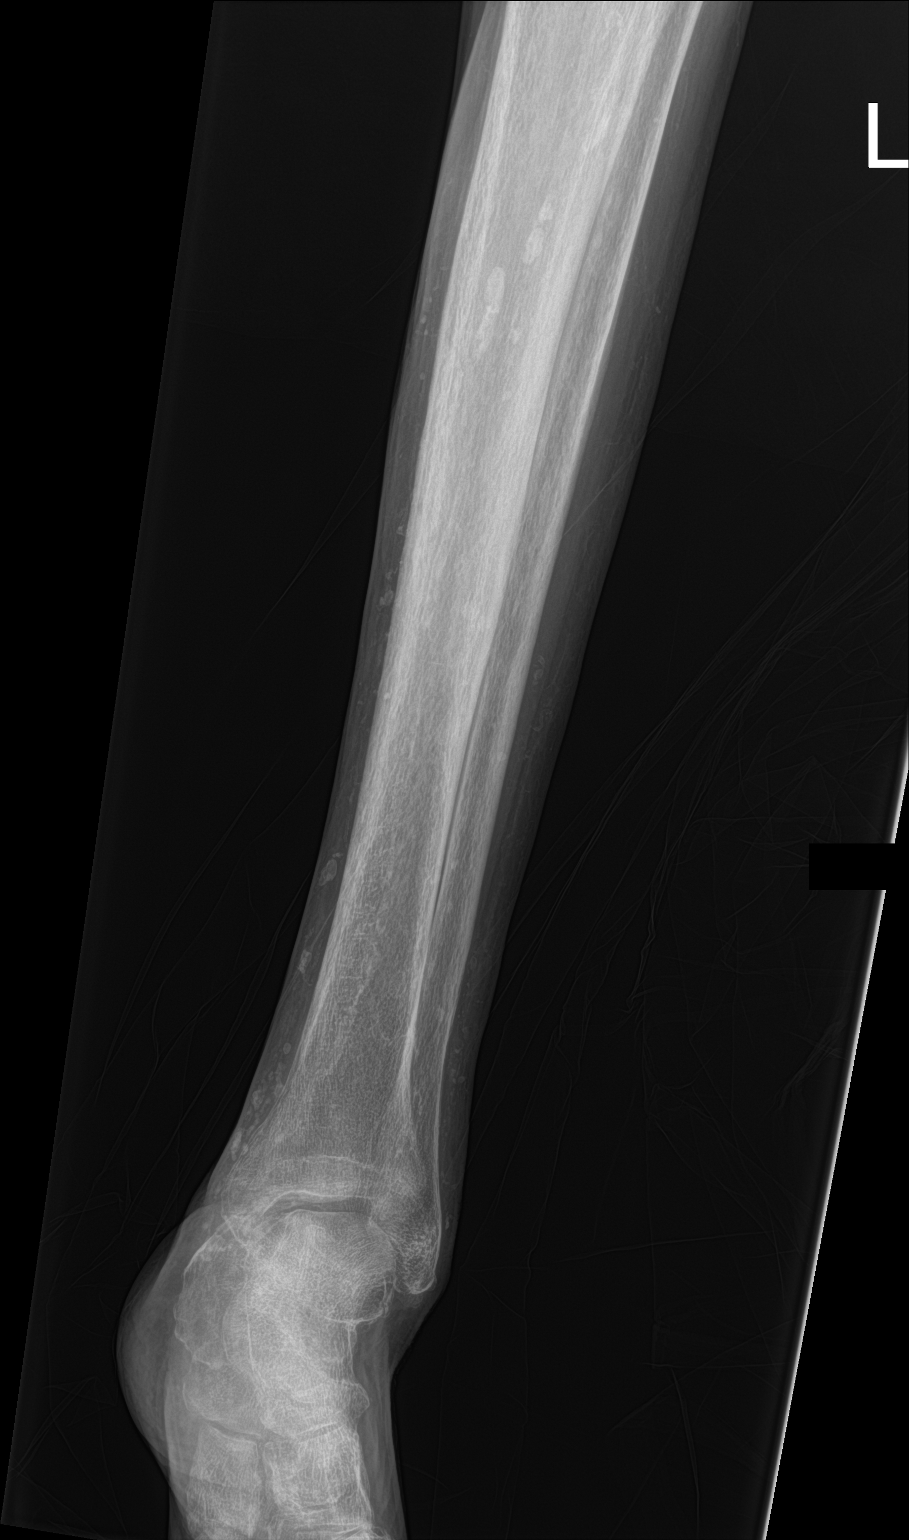
[im 2/2]
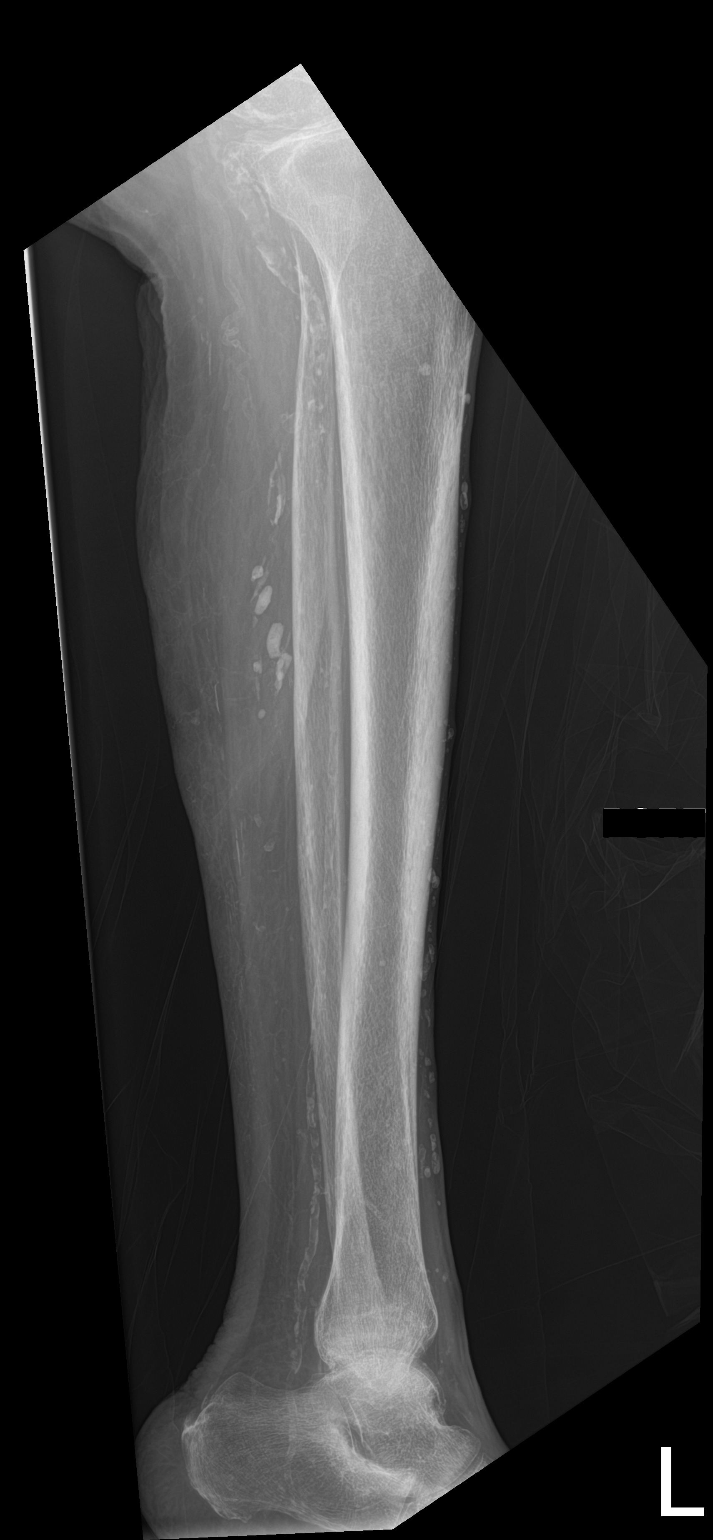

[2 of 2 positions shown; findings below may reference images not displayed]

FINDINGS: Frontal and lateral views of the left tibia and fibula are obtained.
Portions of the proximal tibia and fibula are excluded by
collimation. Bones are osteopenic. No acute displaced fractures.
Soft tissues are unremarkable.
IMPRESSION: 1. No acute fracture.
